# Patient Record
Sex: Female | Born: 1950 | Race: White | Hispanic: No | Smoking: Former smoker
Health system: Southern US, Community
[De-identification: ages and names within clinical notes are randomized; demographics above are authoritative.]

## PROBLEM LIST (undated history)

## (undated) DIAGNOSIS — I10 Essential (primary) hypertension: Secondary | ICD-10-CM

## (undated) DIAGNOSIS — E785 Hyperlipidemia, unspecified: Secondary | ICD-10-CM

## (undated) DIAGNOSIS — E039 Hypothyroidism, unspecified: Secondary | ICD-10-CM

## (undated) DIAGNOSIS — K589 Irritable bowel syndrome without diarrhea: Secondary | ICD-10-CM

## (undated) DIAGNOSIS — K219 Gastro-esophageal reflux disease without esophagitis: Secondary | ICD-10-CM

## (undated) DIAGNOSIS — M1990 Unspecified osteoarthritis, unspecified site: Secondary | ICD-10-CM

## (undated) HISTORY — DX: Hyperlipidemia, unspecified: E78.5

## (undated) HISTORY — DX: Gastro-esophageal reflux disease without esophagitis: K21.9

## (undated) HISTORY — PX: JOINT REPLACEMENT: SHX530

## (undated) HISTORY — DX: Unspecified osteoarthritis, unspecified site: M19.90

## (undated) HISTORY — PX: COLONOSCOPY: SHX174

## (undated) HISTORY — DX: Essential (primary) hypertension: I10

## (undated) HISTORY — PX: ABDOMINAL HYSTERECTOMY: SHX81

## (undated) HISTORY — DX: Hypothyroidism, unspecified: E03.9

## (undated) HISTORY — PX: BILATERAL OOPHORECTOMY: SHX1221

## (undated) HISTORY — PX: CHOLECYSTECTOMY: SHX55

## (undated) HISTORY — DX: Irritable bowel syndrome, unspecified: K58.9

## (undated) HISTORY — PX: SHOULDER SURGERY: SHX246

## (undated) HISTORY — PX: HIP SURGERY: SHX245

---

## 2003-06-18 ENCOUNTER — Other Ambulatory Visit: Payer: Self-pay

## 2005-07-30 ENCOUNTER — Observation Stay: Payer: Self-pay

## 2005-07-30 ENCOUNTER — Other Ambulatory Visit: Payer: Self-pay

## 2005-08-21 ENCOUNTER — Ambulatory Visit: Payer: Self-pay | Admitting: Surgery

## 2006-02-27 ENCOUNTER — Ambulatory Visit: Payer: Self-pay

## 2007-05-25 ENCOUNTER — Ambulatory Visit: Payer: Self-pay

## 2008-06-13 ENCOUNTER — Ambulatory Visit: Payer: Self-pay

## 2009-01-14 ENCOUNTER — Emergency Department (HOSPITAL_COMMUNITY): Admission: EM | Admit: 2009-01-14 | Discharge: 2009-01-14 | Payer: Self-pay | Admitting: Family Medicine

## 2009-10-15 ENCOUNTER — Ambulatory Visit (HOSPITAL_COMMUNITY): Admission: RE | Admit: 2009-10-15 | Discharge: 2009-10-15 | Payer: Self-pay | Admitting: Family Medicine

## 2009-11-26 ENCOUNTER — Ambulatory Visit (HOSPITAL_COMMUNITY): Admission: RE | Admit: 2009-11-26 | Discharge: 2009-11-26 | Payer: Self-pay | Admitting: Family Medicine

## 2009-12-13 ENCOUNTER — Ambulatory Visit (HOSPITAL_COMMUNITY): Admission: RE | Admit: 2009-12-13 | Discharge: 2009-12-13 | Payer: Self-pay | Admitting: Orthopedic Surgery

## 2009-12-26 ENCOUNTER — Encounter (HOSPITAL_COMMUNITY): Admission: RE | Admit: 2009-12-26 | Discharge: 2010-01-25 | Payer: Self-pay | Admitting: Orthopedic Surgery

## 2010-01-28 ENCOUNTER — Encounter (HOSPITAL_COMMUNITY): Admission: RE | Admit: 2010-01-28 | Discharge: 2010-02-27 | Payer: Self-pay | Admitting: Orthopedic Surgery

## 2010-09-20 ENCOUNTER — Ambulatory Visit (HOSPITAL_COMMUNITY)
Admission: RE | Admit: 2010-09-20 | Discharge: 2010-09-20 | Disposition: A | Discharge status: Home / Self Care | Payer: 59 | Source: Ambulatory Visit | Attending: Family Medicine | Admitting: Family Medicine

## 2010-09-20 ENCOUNTER — Other Ambulatory Visit (HOSPITAL_COMMUNITY): Payer: Self-pay | Admitting: Family Medicine

## 2010-09-20 DIAGNOSIS — M1610 Unilateral primary osteoarthritis, unspecified hip: Secondary | ICD-10-CM | POA: Insufficient documentation

## 2010-09-20 DIAGNOSIS — M169 Osteoarthritis of hip, unspecified: Secondary | ICD-10-CM | POA: Insufficient documentation

## 2010-09-20 DIAGNOSIS — M25551 Pain in right hip: Secondary | ICD-10-CM

## 2010-09-20 DIAGNOSIS — M25559 Pain in unspecified hip: Secondary | ICD-10-CM | POA: Insufficient documentation

## 2010-10-07 LAB — COMPREHENSIVE METABOLIC PANEL
ALT: 21 U/L (ref 0–35)
AST: 22 U/L (ref 0–37)
Albumin: 4 g/dL (ref 3.5–5.2)
Alkaline Phosphatase: 81 U/L (ref 39–117)
CO2: 26 mEq/L (ref 19–32)
Chloride: 107 mEq/L (ref 96–112)
Creatinine, Ser: 0.84 mg/dL (ref 0.4–1.2)
GFR calc Af Amer: >60 mL/min (ref >60)
Potassium: 4.2 mEq/L (ref 3.5–5.1)
Sodium: 140 mEq/L (ref 135–145)

## 2010-10-07 LAB — CBC
HCT: 44.7 % (ref 36.0–46.0)
Platelets: 262 10*3/uL (ref 150–400)
RDW: 12.9 % (ref 11.5–15.5)
WBC: 7.5 10*3/uL (ref 4.0–10.5)

## 2010-10-07 LAB — URINE MICROSCOPIC-ADD ON

## 2010-10-07 LAB — URINALYSIS, ROUTINE W REFLEX MICROSCOPIC
Glucose, UA: NEGATIVE mg/dL
Ketones, ur: NEGATIVE mg/dL
Nitrite: NEGATIVE
Protein, ur: NEGATIVE mg/dL
Specific Gravity, Urine: 1.019 (ref 1.005–1.030)

## 2011-01-03 ENCOUNTER — Other Ambulatory Visit: Payer: Self-pay | Admitting: Family Medicine

## 2011-01-03 DIAGNOSIS — Z139 Encounter for screening, unspecified: Secondary | ICD-10-CM

## 2011-01-07 ENCOUNTER — Ambulatory Visit (HOSPITAL_COMMUNITY)
Admission: RE | Admit: 2011-01-07 | Discharge: 2011-01-07 | Disposition: A | Discharge status: Home / Self Care | Payer: 59 | Source: Ambulatory Visit | Attending: Family Medicine | Admitting: Family Medicine

## 2011-01-07 DIAGNOSIS — Z1231 Encounter for screening mammogram for malignant neoplasm of breast: Secondary | ICD-10-CM | POA: Insufficient documentation

## 2011-01-07 DIAGNOSIS — Z139 Encounter for screening, unspecified: Secondary | ICD-10-CM

## 2011-01-29 ENCOUNTER — Other Ambulatory Visit (HOSPITAL_COMMUNITY): Payer: Self-pay | Admitting: Orthopedic Surgery

## 2011-01-29 ENCOUNTER — Other Ambulatory Visit: Payer: Self-pay | Admitting: Orthopedic Surgery

## 2011-01-29 ENCOUNTER — Ambulatory Visit (HOSPITAL_COMMUNITY)
Admission: RE | Admit: 2011-01-29 | Discharge: 2011-01-29 | Disposition: A | Discharge status: Home / Self Care | Payer: 59 | Source: Ambulatory Visit | Attending: Orthopedic Surgery | Admitting: Orthopedic Surgery

## 2011-01-29 ENCOUNTER — Encounter (HOSPITAL_COMMUNITY): Payer: 59

## 2011-01-29 DIAGNOSIS — Z01810 Encounter for preprocedural cardiovascular examination: Secondary | ICD-10-CM | POA: Insufficient documentation

## 2011-01-29 DIAGNOSIS — Z01818 Encounter for other preprocedural examination: Secondary | ICD-10-CM

## 2011-01-29 DIAGNOSIS — I10 Essential (primary) hypertension: Secondary | ICD-10-CM | POA: Insufficient documentation

## 2011-01-29 DIAGNOSIS — Z01812 Encounter for preprocedural laboratory examination: Secondary | ICD-10-CM | POA: Insufficient documentation

## 2011-01-29 LAB — CBC
HCT: 41.2 % (ref 36.0–46.0)
Hemoglobin: 13.5 g/dL (ref 12.0–15.0)
MCH: 30.1 pg (ref 26.0–34.0)
MCV: 91.8 fL (ref 78.0–100.0)
Platelets: 265 10*3/uL (ref 150–400)
RBC: 4.49 MIL/uL (ref 3.87–5.11)
WBC: 6.7 10*3/uL (ref 4.0–10.5)

## 2011-01-29 LAB — URINE MICROSCOPIC-ADD ON

## 2011-01-29 LAB — BASIC METABOLIC PANEL
Calcium: 9.6 mg/dL (ref 8.4–10.5)
Chloride: 104 mEq/L (ref 96–112)
GFR calc Af Amer: 57 mL/min — ABNORMAL LOW (ref >60)
GFR calc non Af Amer: 47 mL/min — ABNORMAL LOW (ref >60)
Potassium: 4.1 mEq/L (ref 3.5–5.1)

## 2011-01-29 LAB — DIFFERENTIAL
Eosinophils Absolute: 0.2 10*3/uL (ref 0.0–0.7)
Lymphocytes Relative: 41 % (ref 12–46)
Monocytes Relative: 10 % (ref 3–12)
Neutrophils Relative %: 46 % (ref 43–77)

## 2011-01-29 LAB — URINALYSIS, ROUTINE W REFLEX MICROSCOPIC
Hgb urine dipstick: NEGATIVE
Protein, ur: NEGATIVE mg/dL
Specific Gravity, Urine: 1.028 (ref 1.005–1.030)
pH: 5.5 (ref 5.0–8.0)

## 2011-01-29 LAB — PROTIME-INR
INR: 1.03 (ref 0.00–1.49)
Prothrombin Time: 13.7 seconds (ref 11.6–15.2)

## 2011-01-29 LAB — APTT: aPTT: 30 seconds (ref 24–37)

## 2011-01-29 NOTE — H&P (Unsigned)
[name], Larsen NO.:  [id number]  MEDICAL RECORD NO.:  [id number]  LOCATION:  XRAY                         FACILITY:  [facility]  PHYSICIAN:  Madlyn Frankel. [name], [name]  DATE OF BIRTH:  Jun 25, 1951  DATE OF ADMISSION:  01/29/2011 DATE OF DISCHARGE:                             HISTORY & PHYSICAL   DATE OF SURGERY:  February 11, 2011.  Note, the patient's medical doctor is Dr. Lubertha South.  She will be going home after surgery.  She is a candidate for tranexamic acid and will receive that at surgery time and she is given her home medications of aspirin, Robaxin, iron, MiraLax and Colace to take postoperatively.  CHIEF COMPLAINT:  Right hip osteoarthritis.  HISTORY OF PRESENT ILLNESS:  This is a 60 year old lady with a history of osteoarthritis of right hip with failure of conservative treatment to alleviate her pain.  After discussion of treatments, benefits, risks and options, the patient now scheduled for anterior total hip arthroplasty on the right.  She has had medical clearance by Dr. Gerda Diss, her medical doctor.  PAST MEDICAL HISTORY:  Drug allergies none.  CURRENT MEDICATIONS: 1. Levothyroxine 150 mcg daily. 2. Pantoprazole 40 mg daily. 3. Hyoscyamine ER 0.375 mg b.i.d. 4. Benazepril. 5. Hydrochlorothiazide 20 mg 1-1/2 tablet daily. 6. Tramadol 50 mg 1 q.6 p.r.n. pain.  PAST SURGERIES:  Hysterectomy, cholecystectomy and shoulder surgery. Serious medical illnesses include hypertension, irritable bowel syndrome, reflux.  FAMILY HISTORY:  Positive for coronary artery disease.  SOCIAL HISTORY:  The patient lives at home.  She does not smoke and does not drink.  REVIEW OF SYSTEMS:  CENTRAL NERVOUS SYSTEM:  Negative for headache, blurred vision or dizziness.  PULMONARY:  Negative for shortness breath, PND or orthopnea.  CARDIOVASCULAR:  Negative for chest pain or palpitation.  GI:  Positive for history of irritable bowel syndrome and reflux.  Negative for  ulcers.  GU:  Negative for urinary tract difficulty.  MUSCULOSKELETAL:  Positive in HPI.  PHYSICAL EXAMINATION:  VITAL SIGNS:  BP 132/78, respirations 16, pulse 82 and regular. GENERAL:  This is well-developed, well-nourished lady in no acute distress. HEENT:  Head normocephalic.  Nose patent.  Ears patent.  Pupils are equal, round and reactive to light.  Throat without injection.  NECK: Supple without adenopathy.  Carotids are 2+ without bruit. CHEST:  Clear to auscultation.  No rales or rhonchi.  Respirations 16. HEART:  Regular rate and rhythm at 82 beats per minute without murmur. ABDOMEN:  Soft with active bowel sounds.  No masses, organomegaly. NEUROLOGIC:  The patient is alert and oriented to time, place and person.  Cranial nerves II-XII grossly intact. EXTREMITIES:  The right hip with decreased range of motion with pain to all motion.  Neurovascular status intact.  IMPRESSION:  Right hip osteoarthritis.  PLAN:  Total hip arthroplasty, anterior approach right hip.     [name]. [name], P.A.   ______________________________ Madlyn Frankel [name], [name]    SJC/MEDQ  D:  01/29/2011  T:  01/29/2011  Job:  [id number]

## 2011-02-11 ENCOUNTER — Inpatient Hospital Stay (HOSPITAL_COMMUNITY): Payer: 59

## 2011-02-11 ENCOUNTER — Inpatient Hospital Stay (HOSPITAL_COMMUNITY)
Admission: RE | Admit: 2011-02-11 | Discharge: 2011-02-13 | DRG: 470 | Disposition: A | Discharge status: Home Health | Payer: 59 | Source: Ambulatory Visit | Attending: Orthopedic Surgery | Admitting: Orthopedic Surgery

## 2011-02-11 DIAGNOSIS — M1610 Unilateral primary osteoarthritis, unspecified hip: Principal | ICD-10-CM | POA: Diagnosis present

## 2011-02-11 DIAGNOSIS — I10 Essential (primary) hypertension: Secondary | ICD-10-CM | POA: Diagnosis present

## 2011-02-11 DIAGNOSIS — K219 Gastro-esophageal reflux disease without esophagitis: Secondary | ICD-10-CM | POA: Diagnosis present

## 2011-02-11 DIAGNOSIS — E039 Hypothyroidism, unspecified: Secondary | ICD-10-CM | POA: Diagnosis present

## 2011-02-11 DIAGNOSIS — M169 Osteoarthritis of hip, unspecified: Principal | ICD-10-CM | POA: Diagnosis present

## 2011-02-11 DIAGNOSIS — Z01818 Encounter for other preprocedural examination: Secondary | ICD-10-CM

## 2011-02-11 DIAGNOSIS — Z01810 Encounter for preprocedural cardiovascular examination: Secondary | ICD-10-CM

## 2011-02-11 DIAGNOSIS — Z01812 Encounter for preprocedural laboratory examination: Secondary | ICD-10-CM

## 2011-02-11 LAB — TYPE AND SCREEN
ABO/RH(D): O NEG
Antibody Screen: NEGATIVE

## 2011-02-12 LAB — BASIC METABOLIC PANEL
BUN: 13 mg/dL (ref 6–23)
Calcium: 9 mg/dL (ref 8.4–10.5)
Creatinine, Ser: 0.77 mg/dL (ref 0.50–1.10)
GFR calc Af Amer: >60 mL/min (ref >60)
Sodium: 138 mEq/L (ref 135–145)

## 2011-02-12 LAB — CBC
MCH: 30.3 pg (ref 26.0–34.0)
MCHC: 34.4 g/dL (ref 30.0–36.0)
Platelets: 280 10*3/uL (ref 150–400)

## 2011-02-13 LAB — BASIC METABOLIC PANEL
BUN: 12 mg/dL (ref 6–23)
CO2: 26 mEq/L (ref 19–32)
Chloride: 108 mEq/L (ref 96–112)
Creatinine, Ser: 0.74 mg/dL (ref 0.50–1.10)
GFR calc Af Amer: >60 mL/min (ref >60)
Glucose, Bld: 104 mg/dL — ABNORMAL HIGH (ref 70–99)
Potassium: 3.2 mEq/L — ABNORMAL LOW (ref 3.5–5.1)

## 2011-02-13 LAB — CBC
HCT: 31.4 % — ABNORMAL LOW (ref 36.0–46.0)
Hemoglobin: 10.7 g/dL — ABNORMAL LOW (ref 12.0–15.0)
MCV: 90 fL (ref 78.0–100.0)
RDW: 13.2 % (ref 11.5–15.5)
WBC: 9.4 10*3/uL (ref 4.0–10.5)

## 2011-02-17 NOTE — Op Note (Signed)
NAMEJOZIE, WULF NO.:  [id number]  MEDICAL RECORD NO.:  [id number]  LOCATION:  1616                         FACILITY:  [facility]  PHYSICIAN:  Madlyn Frankel. Charlann Boxer, [name]  DATE OF BIRTH:  1950-12-09  DATE OF PROCEDURE:  02/11/2011 DATE OF DISCHARGE:                              OPERATIVE REPORT   PREOPERATIVE DIAGNOSIS:  Right hip osteoarthritis.  POSTOPERATIVE DIAGNOSIS:  Right hip osteoarthritis.  PROCEDURE:  Right total hip replacement through an anterior approach.  COMPONENTS USED:  DePuy hip system size 52 Pinnacle cup, 36 +4 neutral AltrX liner, 4 standard Tri-Lock stem with a 36 +1.5 Delta ceramic ball.  SURGEON:  Madlyn Frankel. Charlann Boxer, MD  ASSISTANT:  Lanney Gins, PA-C, plus [name]. Chabon, PA-C  ANESTHESIA:  General.  BLOOD LOSS:  600 cc.  DRAINS:  One Hemovac.  COMPLICATIONS:  None.  SPECIMEN:  None.  INDICATIONS OF PROCEDURE:  Ms. Flannigan is a 60 year old female who presented to the office for right hip pain.  Radiographs revealed extensive bone-on-bone changes.  She had failed to respond to conservative measures and her quality of life was significantly effected.  She wished at this point to proceed with hip arthroplasty. The risk of infection, DVT, component failure, need for revision surgery, and dislocation, were all discussed and reviewed.  Surgical approach, anterior approach, posterior approach were discussed.  She at this point wished to proceed with an anterior approach to a total hip replacement for pain relief.  Consent was obtained.  PROCEDURE IN DETAIL:  The patient was brought to operative theater. Once adequate anesthesia, preoperative antibiotics, Ancef administered, the patient was positioned supine on the OSI Hana table.  Once adequately positioned with the right arm placed over the chest, the right hip area was initially prepped out.  Fluoroscopy was used to confirm the landmarks and position of the pelvis on the  table.  The right hip was then prepped and draped from proximal iliac crest to the mid thigh with a shower curtain technique.  A time-out was performed identifying the patient, planned procedure, and extremity.  At this point, an incision was made 2 cm lateral distal to the anterior- superior iliac spine.  Sharp dissection was carried to the fascia and the tensor fascia muscle.  The fascia was incised, the muscle then identified and swept laterally, retractor placed along the superior neck.  After cauterizing the circumflex vessels removing pericapsular fat, a 2nd retractor placed on the inferior neck.  After elevating the rectus femoris from the anterior acetabulum, retractors were placed in the anterior rim.  I then did a capsulotomy from the anterior rim to the trochanteric fossa, then extended it down distally to the lesser trochanteric region.  This also extended proximally.  Stay sutures were placed and retractor placed intracapsular.  At this point, traction was applied to the femur.  We identified landmarks and confirmed location of the neck cut.  The neck osteotomy was made and femoral head was removed without difficulty and noted severe degenerative changes.  Traction was taken off the femur and attention was now directed to the acetabulum.  Acetabular retractors were placed, posterior retractor and anterior retractor.  Labrum and foveal tissue debrided.  At this point, I began reaming with a 44 reamer and reamed up to a 51 reamer with excellent bony bed preparation.  The final reaming was done under fluoroscopic to confirm position.  The final 52 Pinnacle cup was then chosen and then impacted with a good initial scratch fit using fluoro to confirm the location and the amount of abduction.  Single cancellous screw was placed in the ilium to support initial scratch fit and hole eliminator placed and the final 36 +4 AltrX liner impacted in position.  At this point,  lateral hook was placed on the lateral femur.  The femur was elevated by hand and held in position with table.  The femur was then rotated externally and I released capsule off the inferior neck.  Retractors placed on the medial neck.  At this point, the capsule on the anterior aspect of hip was released along the superior edge to allow for further exposure as well as debridement of some of the soft tissues into the trochanteric fossa.  The leg was then extended and abducted.  At this point, a box osteotome was used to create a starting opening followed by passage of the starting broach by hand.  Basically, I broached to 1 and then confirmed radiographically that the stem was going to be in the correct orientation AP and lateral planes.  Once this was done, I began broaching and broached up to a size 4.  With this size 4 broach in place, a standard offset neck was chosen and trial reduction was carried out 36 +1.5 ball.  The stem appeared to fit well within the proximal aspect of femur and leg lengths were cemented on the greater and lesser trochanters through the ischium.  Given these findings, the hip was dislocated.  The retractors were placed.  The trial components were removed.  The final 4 standard Tri- Lock stem was opened and impacted, it sat at the level of neck cut. After a trial reduction, again I chose a 36 1.5 ball.  The final 36 1.5 Delta ceramic ball was then impacted onto clean and dry trunnion.  The hip was reduced.  We irrigated the hip throughout the case again at this point.  I reapproximated the anterior capsular tissues using #1 Vicryl. The medium Hemovac drain was placed deep in fascia.  The tensor fascia lata muscle was then reapproximated using #1 Vicryl.  The remaining of the wound was closed with 2-0 Vicryl and running 4-0 Monocryl.  The hip was cleaned, dried, and dressed sterilely using Dermabond and Aquacel dressing. Drain site dressed separately.  The  patient was then brought to recovery room in stable condition tolerating the procedure well.     Madlyn Frankel Charlann Boxer, [name]     MDO/MEDQ  D:  02/11/2011  T:  02/12/2011  Job:  [id number]  Electronically Signed by Durene Romans [name] on 02/17/2011 09:29:45 AM

## 2011-03-03 NOTE — Discharge Summary (Signed)
  NAMEVALE, PERAZA NO.:  [id number]  MEDICAL RECORD NO.:  [id number]  LOCATION:  1616                         FACILITY:  [facility]  PHYSICIAN:  Madlyn Frankel. Charlann Boxer, [name]  DATE OF BIRTH:  [birth year]  DATE OF ADMISSION:  02/11/2011 DATE OF DISCHARGE:  02/13/2011                              DISCHARGE SUMMARY   PROCEDURE:  Right total hip arthroplasty.  ADMITTING DIAGNOSIS:  Right hip osteoarthritis.  DISCHARGE DIAGNOSES: 1. Status post total right hip arthroplasty. 2. Hypothyroidism. 3. Gastroesophageal reflux disease. 4. Hypertension.  HISTORY OF PRESENT ILLNESS:  The patient is a 60 year old white lady with a history of osteoarthritis of the right hip. It has been causing pain for quite some time. X-rays in the clinic show degeneration of the right hip.  The patient has tried conservative treatment which has failed.  Various options were discussed with the patient. The patient wishes to proceed with surgery.  Risk, benefits, and expectations of the procedure were discussed with the patient. The patient understand the risks, benefits, and expectations and wishes to proceed with a right total hip arthroplasty per Dr. Charlann Boxer on February 11, 2011 on inpatient basis.  HOSPITAL COURSE:  The patient underwent the above stated procedure.  The patient tolerated the procedure well and was brought to the recovery room in good condition, subsequently to the floor, while throughout the stay, the patients vital signs were stable.  He was afebrile.  The patient had minimal concerns of pain and no real incidences.  Labs were good.  Patient progressed well with physical therapy and recovery so much so that it was felt that she was well enough to be discharged on February 14, 2011.  DISCHARGE CONDITION:  Good.  DISCHARGE PLAN:  The patient  be discharged from the hospital on February 13, 2011 to the home with home PT. The patient will follow with the [facility] and Dr.  Charlann Boxer in 2 weeks. The patient is to be weightbearing as tolerated. The patient is to maintain the surgical dressing for 8 days in place with gauze and tape, keeping the area clean and dry the whole time. The patient was discharged on medications: 1. Levothyroxine 150 mcg daily. 2. Pantoprazole 40 mg daily. 3. Hyoscyamine ER 0.375 mg p.o. b.i.d. 4. Benazepril. 5. HCTZ 20 mg 1-1/2 tablets daily. 6. Norco 7.5/325 mg 1 to 2 p.o. q.4-6 h. 7. Aspirin 325 mg 1 p.o. b.i.d. for 6 weeks. 8. Robaxin 500 mg 1 p.o. q.6 h. p.r.n. muscle spasm. 9. Iron sulfate 325 mg 1 p.o. t.i.d. for 2 or 3 weeks. 10.Colace 100 mg 1 p.o. b.i.d., constipation. 11.MiraLax 17 g 1 p.o. daily, constipation.    ______________________________ Lanney Gins, PA   ______________________________ Madlyn Frankel. Charlann Boxer, [name]    MB/MEDQ  D:  02/13/2011  T:  02/14/2011  Job:  [id number]  Electronically Signed by Lanney Gins PA on 02/26/2011 04:09:47 PM Electronically Signed by Durene Romans [name] on 03/03/2011 07:33:13 AM

## 2011-12-02 ENCOUNTER — Other Ambulatory Visit: Payer: Self-pay | Admitting: Family Medicine

## 2011-12-02 DIAGNOSIS — Z139 Encounter for screening, unspecified: Secondary | ICD-10-CM

## 2012-01-09 ENCOUNTER — Ambulatory Visit (HOSPITAL_COMMUNITY)
Admission: RE | Admit: 2012-01-09 | Discharge: 2012-01-09 | Disposition: A | Discharge status: Home / Self Care | Payer: 59 | Source: Ambulatory Visit | Attending: Family Medicine | Admitting: Family Medicine

## 2012-01-09 DIAGNOSIS — Z139 Encounter for screening, unspecified: Secondary | ICD-10-CM

## 2012-01-09 DIAGNOSIS — Z1231 Encounter for screening mammogram for malignant neoplasm of breast: Secondary | ICD-10-CM | POA: Insufficient documentation

## 2012-11-22 ENCOUNTER — Other Ambulatory Visit (HOSPITAL_COMMUNITY): Payer: Self-pay | Admitting: Family Medicine

## 2012-11-22 ENCOUNTER — Other Ambulatory Visit: Payer: Self-pay | Admitting: Family Medicine

## 2012-11-22 ENCOUNTER — Other Ambulatory Visit: Payer: Self-pay | Admitting: Nurse Practitioner

## 2012-12-06 ENCOUNTER — Other Ambulatory Visit: Payer: Self-pay | Admitting: Family Medicine

## 2012-12-06 DIAGNOSIS — IMO0001 Reserved for inherently not codable concepts without codable children: Secondary | ICD-10-CM

## 2013-01-13 ENCOUNTER — Ambulatory Visit (HOSPITAL_COMMUNITY)
Admission: RE | Admit: 2013-01-13 | Discharge: 2013-01-13 | Disposition: A | Discharge status: Home / Self Care | Payer: 59 | Source: Ambulatory Visit | Attending: Family Medicine | Admitting: Family Medicine

## 2013-01-13 DIAGNOSIS — Z1231 Encounter for screening mammogram for malignant neoplasm of breast: Secondary | ICD-10-CM | POA: Insufficient documentation

## 2013-01-13 DIAGNOSIS — IMO0001 Reserved for inherently not codable concepts without codable children: Secondary | ICD-10-CM

## 2013-01-14 ENCOUNTER — Ambulatory Visit (HOSPITAL_COMMUNITY): Payer: 59

## 2013-01-26 ENCOUNTER — Other Ambulatory Visit: Payer: Self-pay | Admitting: Family Medicine

## 2013-01-28 ENCOUNTER — Encounter: Payer: Self-pay | Admitting: *Deleted

## 2013-03-11 ENCOUNTER — Ambulatory Visit (HOSPITAL_COMMUNITY)
Admission: RE | Admit: 2013-03-11 | Discharge: 2013-03-11 | Disposition: A | Discharge status: Home / Self Care | Payer: 59 | Source: Ambulatory Visit | Attending: Family Medicine | Admitting: Family Medicine

## 2013-03-11 ENCOUNTER — Encounter: Payer: Self-pay | Admitting: Family Medicine

## 2013-03-11 ENCOUNTER — Ambulatory Visit (INDEPENDENT_AMBULATORY_CARE_PROVIDER_SITE_OTHER): Payer: 59 | Admitting: Family Medicine

## 2013-03-11 VITALS — BP 134/88 | Ht 65.0 in | Wt 208.8 lb

## 2013-03-11 DIAGNOSIS — M25559 Pain in unspecified hip: Secondary | ICD-10-CM

## 2013-03-11 DIAGNOSIS — M25551 Pain in right hip: Secondary | ICD-10-CM

## 2013-03-11 DIAGNOSIS — E785 Hyperlipidemia, unspecified: Secondary | ICD-10-CM | POA: Insufficient documentation

## 2013-03-11 DIAGNOSIS — I10 Essential (primary) hypertension: Secondary | ICD-10-CM

## 2013-03-11 DIAGNOSIS — Z79899 Other long term (current) drug therapy: Secondary | ICD-10-CM

## 2013-03-11 DIAGNOSIS — M47817 Spondylosis without myelopathy or radiculopathy, lumbosacral region: Secondary | ICD-10-CM | POA: Insufficient documentation

## 2013-03-11 DIAGNOSIS — M51379 Other intervertebral disc degeneration, lumbosacral region without mention of lumbar back pain or lower extremity pain: Secondary | ICD-10-CM | POA: Insufficient documentation

## 2013-03-11 DIAGNOSIS — K219 Gastro-esophageal reflux disease without esophagitis: Secondary | ICD-10-CM | POA: Insufficient documentation

## 2013-03-11 DIAGNOSIS — M5137 Other intervertebral disc degeneration, lumbosacral region: Secondary | ICD-10-CM | POA: Insufficient documentation

## 2013-03-11 DIAGNOSIS — E039 Hypothyroidism, unspecified: Secondary | ICD-10-CM

## 2013-03-11 NOTE — Progress Notes (Signed)
  Subjective:    Patient ID: Deanna Larsen, female    DOB: [birth year], 62 y.o.   MRN: [id number]  Hyperlipidemia This is a chronic problem. The problem is controlled. Recent lipid tests were reviewed and are normal. Exacerbating diseases include hypothyroidism. She has no history of chronic renal disease or diabetes. There are no known factors aggravating her hyperlipidemia. Current antihyperlipidemic treatment includes statins. The current treatment provides moderate improvement of lipids. There are no compliance problems.  Risk factors for coronary artery disease include dyslipidemia.     Pt here for intermitting right hip pain. Started 2 weeks ago. Would like an x-ray.   Patient also neglected to return for her six-month followup visit. Reports her reflux is stable. Stain with medicines.  Patient claims compliance with her blood pressure medicine. Trying to watch her salt intake. She is exercising some.  No symptoms of high or low thyroid. No blood work check since last fall. Tries not to miss any pills no fatigue.  Claims compliance with cholesterol medicine. Mostly stain with the dosage he tried watch her diet    Review of Systems No chest pain no abdominal pain no back pain no shortness of breath ROS otherwise negative    Objective:   Physical Exam  Alert HEENT normal. Lungs clear. Heart regular in rhythm. Feet without edema. Right posterior hip tenderness deep palpation hip good range of motion negative straight leg raise      Assessment & Plan:  Impression probable non-articular right hip pain posterior in the region of bursa or ligaments. Patient very worried about her surgical hip site #2 hypertension good control. #3 hyperlipidemia status uncertain. #4 hypothyroidism status uncertain. Plan diet exercise discussed in encourage. Appropriate blood work. Further recommendations based on results. X-ray right hip addendum x-ray returned very good. Symptomatic care discussed.  WSL

## 2013-03-16 LAB — LIPID PANEL
HDL: 48 mg/dL (ref >39)
LDL Cholesterol: 119 mg/dL — ABNORMAL HIGH (ref 0–99)
Total CHOL/HDL Ratio: 3.9 Ratio
VLDL: 20 mg/dL (ref 0–40)

## 2013-03-16 LAB — BASIC METABOLIC PANEL
CO2: 28 mEq/L (ref 19–32)
Calcium: 9.9 mg/dL (ref 8.4–10.5)
Glucose, Bld: 98 mg/dL (ref 70–99)
Potassium: 4.4 mEq/L (ref 3.5–5.3)
Sodium: 140 mEq/L (ref 135–145)

## 2013-03-16 LAB — HEPATIC FUNCTION PANEL
Albumin: 4 g/dL (ref 3.5–5.2)
Bilirubin, Direct: 0.1 mg/dL (ref 0.0–0.3)
Total Bilirubin: 0.5 mg/dL (ref 0.3–1.2)

## 2013-03-18 ENCOUNTER — Other Ambulatory Visit: Payer: Self-pay

## 2013-03-18 MED ORDER — LEVOTHYROXINE SODIUM 150 MCG PO TABS: 150.0000 ug | ORAL_TABLET | Freq: Every day | ORAL | Status: DC

## 2013-05-23 ENCOUNTER — Other Ambulatory Visit: Payer: Self-pay | Admitting: *Deleted

## 2013-05-23 ENCOUNTER — Telehealth: Payer: Self-pay | Admitting: Family Medicine

## 2013-05-23 MED ORDER — PRAVASTATIN SODIUM 20 MG PO TABS: 20.0000 mg | ORAL_TABLET | Freq: Every day | ORAL | Status: DC

## 2013-05-23 MED ORDER — LEVOTHYROXINE SODIUM 150 MCG PO TABS: 150.0000 ug | ORAL_TABLET | Freq: Every day | ORAL | Status: DC

## 2013-05-23 MED ORDER — BENAZEPRIL HCL 20 MG PO TABS: ORAL_TABLET | ORAL | Status: DC

## 2013-05-23 NOTE — Telephone Encounter (Signed)
Scripts for 90 day supply ready for pick up. Pt notified.

## 2013-05-23 NOTE — Telephone Encounter (Signed)
Needs prescriptions for the following medications:  benazepril (LOTENSIN) 20 MG tablet pravastatin (PRAVACHOL) 20 MG tablet levothyroxine (SYNTHROID) 150 MCG tablet  States she is no longer with [facility] therefore she is not using [facility].  She wants written prescriptions due to the fact she does not know which pharmacy she wants to use.  She only has three days of the prescriptions left before she is completely out.  Please call patient to discuss.

## 2013-08-08 ENCOUNTER — Telehealth: Payer: Self-pay | Admitting: Family Medicine

## 2013-08-08 ENCOUNTER — Other Ambulatory Visit: Payer: Self-pay | Admitting: Nurse Practitioner

## 2013-08-08 DIAGNOSIS — E039 Hypothyroidism, unspecified: Secondary | ICD-10-CM

## 2013-08-08 DIAGNOSIS — Z79899 Other long term (current) drug therapy: Secondary | ICD-10-CM

## 2013-08-08 DIAGNOSIS — E785 Hyperlipidemia, unspecified: Secondary | ICD-10-CM

## 2013-08-08 NOTE — Telephone Encounter (Signed)
Need blood work orders, 6 month follow up here with Eber Jonesarolyn on 09/07/13, please call pt when done [phone]

## 2013-08-08 NOTE — Telephone Encounter (Signed)
Patient notified

## 2013-08-08 NOTE — Telephone Encounter (Signed)
Orders done. Papers at nurses' desk.

## 2013-08-31 LAB — HEPATIC FUNCTION PANEL
ALT: 21 U/L (ref 0–35)
AST: 21 U/L (ref 0–37)
Albumin: 3.7 g/dL (ref 3.5–5.2)
Alkaline Phosphatase: 81 U/L (ref 39–117)
BILIRUBIN TOTAL: 0.5 mg/dL (ref 0.2–1.2)
Bilirubin, Direct: 0.1 mg/dL (ref 0.0–0.3)
Indirect Bilirubin: 0.4 mg/dL (ref 0.2–1.2)
Total Protein: 6.6 g/dL (ref 6.0–8.3)

## 2013-08-31 LAB — LIPID PANEL
Cholesterol: 197 mg/dL (ref 0–200)
HDL: 43 mg/dL (ref >39)
LDL CALC: 128 mg/dL — AB (ref 0–99)
TRIGLYCERIDES: 129 mg/dL (ref <150)
Total CHOL/HDL Ratio: 4.6 Ratio
VLDL: 26 mg/dL (ref 0–40)

## 2013-08-31 LAB — TSH: TSH: 3.162 u[IU]/mL (ref 0.350–4.500)

## 2013-09-07 ENCOUNTER — Ambulatory Visit: Payer: 59 | Admitting: Nurse Practitioner

## 2013-09-14 ENCOUNTER — Ambulatory Visit: Payer: 59 | Admitting: Nurse Practitioner

## 2013-09-21 ENCOUNTER — Encounter: Payer: Self-pay | Admitting: Nurse Practitioner

## 2013-09-21 ENCOUNTER — Ambulatory Visit (INDEPENDENT_AMBULATORY_CARE_PROVIDER_SITE_OTHER): Payer: BC Managed Care – PPO | Admitting: Nurse Practitioner

## 2013-09-21 VITALS — BP 138/86 | Ht 65.0 in | Wt 221.0 lb

## 2013-09-21 DIAGNOSIS — E039 Hypothyroidism, unspecified: Secondary | ICD-10-CM

## 2013-09-21 DIAGNOSIS — I10 Essential (primary) hypertension: Secondary | ICD-10-CM

## 2013-09-21 DIAGNOSIS — E785 Hyperlipidemia, unspecified: Secondary | ICD-10-CM

## 2013-09-21 DIAGNOSIS — K219 Gastro-esophageal reflux disease without esophagitis: Secondary | ICD-10-CM

## 2013-09-21 MED ORDER — BENAZEPRIL HCL 20 MG PO TABS: ORAL_TABLET | ORAL | Status: DC

## 2013-09-21 MED ORDER — LEVOTHYROXINE SODIUM 150 MCG PO TABS: 150.0000 ug | ORAL_TABLET | Freq: Every day | ORAL | Status: DC

## 2013-09-21 MED ORDER — PANTOPRAZOLE SODIUM 40 MG PO TBEC: DELAYED_RELEASE_TABLET | ORAL | Status: DC

## 2013-09-21 MED ORDER — PRAVASTATIN SODIUM 20 MG PO TABS: 20.0000 mg | ORAL_TABLET | Freq: Every day | ORAL | Status: DC

## 2013-09-21 NOTE — Progress Notes (Signed)
Subjective:  Presents for routine followup. Compliant with medications. No chest pain or shortness of breath. No difficulty swallowing. Has been off her Protonix for while. Has a history of GERD. Has been having a flareup lately. Usually worse with certain foods. No change in her bowels. Rare NSAID use. No nausea vomiting. Has had a cholecystectomy. Has begun a walking program about 10 minutes per day. Switch to regular soda from diet based on advice from some of her friends. Has gained about 11 pounds over the past year.  Objective:   BP 138/86  Ht 5\' 5"  (1.651 m)  Wt 221 lb (100.245 kg)  BMI 36.78 kg/m2 NAD. Alert, oriented. Lungs clear. Heart regular rate rhythm. Lower extremities no edema. Thyroid nontender to palpation, no masses or goiter are noted. See lab work 2/11.  Assessment: Problem List Items Addressed This Visit     Cardiovascular and Mediastinum   Essential hypertension, benign - Primary   Relevant Medications      benazepril (LOTENSIN) tablet      pravastatin (PRAVACHOL) tablet     Digestive   Esophageal reflux   Relevant Medications      pantoprazole (PROTONIX) EC tablet     Endocrine   Unspecified hypothyroidism   Relevant Medications      levothyroxine (SYNTHROID, LEVOTHROID) tablet     Other   Other and unspecified hyperlipidemia     Plan: Restart Protonix as directed. Given written and verbal information on lifestyle factors affecting her reflux. Given 6 months refills on her medications. Reminded about preventive health physical. Return in about 6 months (around 03/24/2014). Call back in 2 weeks if reflux persists.

## 2013-09-21 NOTE — Patient Instructions (Signed)

## 2013-11-21 ENCOUNTER — Telehealth: Payer: Self-pay | Admitting: Family Medicine

## 2013-11-21 ENCOUNTER — Other Ambulatory Visit: Payer: Self-pay | Admitting: *Deleted

## 2013-11-21 MED ORDER — PRAVASTATIN SODIUM 20 MG PO TABS: 20.0000 mg | ORAL_TABLET | Freq: Every day | ORAL | Status: DC

## 2013-11-21 MED ORDER — LEVOTHYROXINE SODIUM 150 MCG PO TABS: 150.0000 ug | ORAL_TABLET | Freq: Every day | ORAL | Status: DC

## 2013-11-21 MED ORDER — BENAZEPRIL HCL 20 MG PO TABS: ORAL_TABLET | ORAL | Status: DC

## 2013-11-21 NOTE — Telephone Encounter (Signed)
meds sent to [facility] in [place]. Pt notified on voicemail.

## 2013-11-21 NOTE — Telephone Encounter (Signed)
Patient said [facility] does not have any record of us calling in her medications at her last visit. She needs refills of her benazepril (LOTENSIN) 20 MG tablet, levothyroxine (SYNTHROID) 150 MCG tablet, and pravastatin (PRAVACHOL) 20 MG tablet.    [facility] 

## 2013-12-07 ENCOUNTER — Other Ambulatory Visit: Payer: Self-pay | Admitting: Family Medicine

## 2013-12-07 DIAGNOSIS — Z139 Encounter for screening, unspecified: Secondary | ICD-10-CM

## 2014-01-16 ENCOUNTER — Ambulatory Visit (HOSPITAL_COMMUNITY)
Admission: RE | Admit: 2014-01-16 | Discharge: 2014-01-16 | Disposition: A | Discharge status: Home / Self Care | Payer: BC Managed Care – PPO | Source: Ambulatory Visit | Attending: Family Medicine | Admitting: Family Medicine

## 2014-01-16 DIAGNOSIS — Z139 Encounter for screening, unspecified: Secondary | ICD-10-CM

## 2014-01-16 DIAGNOSIS — Z1231 Encounter for screening mammogram for malignant neoplasm of breast: Secondary | ICD-10-CM | POA: Insufficient documentation

## 2014-05-23 ENCOUNTER — Encounter: Payer: Self-pay | Admitting: Family Medicine

## 2014-05-23 ENCOUNTER — Ambulatory Visit (INDEPENDENT_AMBULATORY_CARE_PROVIDER_SITE_OTHER): Payer: BC Managed Care – PPO | Admitting: Family Medicine

## 2014-05-23 VITALS — Ht 65.0 in

## 2014-05-23 DIAGNOSIS — J329 Chronic sinusitis, unspecified: Secondary | ICD-10-CM

## 2014-05-23 DIAGNOSIS — J310 Chronic rhinitis: Secondary | ICD-10-CM

## 2014-05-23 MED ORDER — BENZONATATE 100 MG PO CAPS: 100.0000 mg | ORAL_CAPSULE | Freq: Four times a day (QID) | ORAL | Status: DC | PRN

## 2014-05-23 MED ORDER — CEFDINIR 300 MG PO CAPS: 300.0000 mg | ORAL_CAPSULE | Freq: Two times a day (BID) | ORAL | Status: DC

## 2014-05-23 NOTE — Progress Notes (Signed)
   Subjective:    Patient ID: Deanna BaarsDeborah H Larsen, female    DOB: July 02, 1951, 63 y.o.   MRN: [id number]  Sinus Problem This is a new problem. The current episode started in the past 7 days. Associated symptoms include congestion, coughing and headaches.   Diminished energy and achey  Headache frontal  Gunky lcongestion  No vom no diarrhea  Had flu shot     Review of Systems  HENT: Positive for congestion.   Respiratory: Positive for cough.   Neurological: Positive for headaches.       Objective:   Physical Exam Alert moderate malaise TMs retracted frontal maxillary tenderness. Pharynx slight erythema neck supple. Lungs clear. Heart regular rate and rhythm.       Assessment & Plan:  Impression acute rhinosinusitis plan antibiotics prescribed. Symptomatic care discussed. Warning signs discussed. WSL

## 2014-06-07 ENCOUNTER — Telehealth: Payer: Self-pay | Admitting: Family Medicine

## 2014-06-07 MED ORDER — HYDROCODONE-HOMATROPINE 5-1.5 MG/5ML PO SYRP: 5.0000 mL | ORAL_SOLUTION | Freq: Every evening | ORAL | Status: DC | PRN

## 2014-06-07 MED ORDER — AMOXICILLIN-POT CLAVULANATE 875-125 MG PO TABS: 1.0000 | ORAL_TABLET | Freq: Two times a day (BID) | ORAL | Status: AC

## 2014-06-07 NOTE — Telephone Encounter (Signed)
Aug 875 bid ten d, hycodan three ounces one tspn qhs prn cough

## 2014-06-07 NOTE — Telephone Encounter (Signed)
Rx sent electronically to pharmacy. Patient notified. 

## 2014-06-07 NOTE — Telephone Encounter (Signed)
Patient seen on 05/23/14 for rhinosinusitis and is still having cough, thick mucous.  Can we give her more medication?  She said that the cough medication she was given is not helping.  [facility]

## 2014-08-15 ENCOUNTER — Encounter: Payer: Self-pay | Admitting: Family Medicine

## 2014-08-15 ENCOUNTER — Ambulatory Visit (INDEPENDENT_AMBULATORY_CARE_PROVIDER_SITE_OTHER): Payer: 59 | Admitting: Family Medicine

## 2014-08-15 VITALS — Temp 98.2°F | Ht 65.0 in | Wt 217.6 lb

## 2014-08-15 DIAGNOSIS — B9689 Other specified bacterial agents as the cause of diseases classified elsewhere: Secondary | ICD-10-CM

## 2014-08-15 DIAGNOSIS — J019 Acute sinusitis, unspecified: Secondary | ICD-10-CM

## 2014-08-15 MED ORDER — LEVOFLOXACIN 500 MG PO TABS: 500.0000 mg | ORAL_TABLET | Freq: Every day | ORAL | Status: DC

## 2014-08-15 NOTE — Progress Notes (Signed)
   Subjective:    Patient ID: Deanna BaarsDeborah H Engebretson, female    DOB: Aug 27, 1950, 64 y.o.   MRN: [id number]  Sinus Problem This is a new problem. The current episode started in the past 7 days. Associated symptoms include congestion, coughing and sinus pressure. Pertinent negatives include no ear pain or shortness of breath.   PMH benign denies chest pressure or tightness   Review of Systems  Constitutional: Negative for fever and activity change.  HENT: Positive for congestion, rhinorrhea and sinus pressure. Negative for ear pain.   Eyes: Negative for discharge.  Respiratory: Positive for cough. Negative for shortness of breath and wheezing.   Cardiovascular: Negative for chest pain.   Patient relates did have some fever and chills a couple days ago. Having a lot of head congestion with postnasal drainage. Relates sinus pain discomfort. Worse over the past few days.    Objective:   Physical Exam  Constitutional: She appears well-developed.  HENT:  Head: Normocephalic.  Nose: Nose normal.  Mouth/Throat: Oropharynx is clear and moist. No oropharyngeal exudate.  Neck: Neck supple.  Cardiovascular: Normal rate and normal heart sounds.   No murmur heard. Pulmonary/Chest: Effort normal and breath sounds normal. She has no wheezes.  Lymphadenopathy:    She has no cervical adenopathy.  Skin: Skin is warm and dry.  Nursing note and vitals reviewed.         Assessment & Plan:  Viral syndrome Acute bacterial rhinosinusitis Antibiotics prescribed Warning signs discuss. Do not feel the patient needs x-rays or lab work currently.

## 2014-08-21 ENCOUNTER — Telehealth: Payer: Self-pay | Admitting: Family Medicine

## 2014-08-21 DIAGNOSIS — E785 Hyperlipidemia, unspecified: Secondary | ICD-10-CM

## 2014-08-21 DIAGNOSIS — E039 Hypothyroidism, unspecified: Secondary | ICD-10-CM

## 2014-08-21 DIAGNOSIS — Z79899 Other long term (current) drug therapy: Secondary | ICD-10-CM

## 2014-08-21 NOTE — Telephone Encounter (Signed)
Pt is requesting labs for upcoming appt on 08/25/14.  Previous labs: Hepatic,Lipid,TSH 08/31/2013

## 2014-08-21 NOTE — Telephone Encounter (Signed)
Pt notified on voicemail that bw orders are ready.

## 2014-08-21 NOTE — Telephone Encounter (Signed)
Lip liv m7 tsh 

## 2014-08-22 LAB — HEPATIC FUNCTION PANEL
ALT: 16 U/L (ref 0–35)
AST: 20 U/L (ref 0–37)
Albumin: 3.9 g/dL (ref 3.5–5.2)
Alkaline Phosphatase: 79 U/L (ref 39–117)
BILIRUBIN INDIRECT: 0.5 mg/dL (ref 0.2–1.2)
Bilirubin, Direct: 0.1 mg/dL (ref 0.0–0.3)
Total Bilirubin: 0.6 mg/dL (ref 0.2–1.2)
Total Protein: 7 g/dL (ref 6.0–8.3)

## 2014-08-22 LAB — LIPID PANEL
CHOL/HDL RATIO: 4.2 ratio
Cholesterol: 194 mg/dL (ref 0–200)
HDL: 46 mg/dL (ref >39)
LDL Cholesterol: 121 mg/dL — ABNORMAL HIGH (ref 0–99)
Triglycerides: 133 mg/dL (ref <150)
VLDL: 27 mg/dL (ref 0–40)

## 2014-08-22 LAB — BASIC METABOLIC PANEL
BUN: 15 mg/dL (ref 6–23)
CO2: 24 meq/L (ref 19–32)
CREATININE: 0.88 mg/dL (ref 0.50–1.10)
Calcium: 9.4 mg/dL (ref 8.4–10.5)
Chloride: 108 mEq/L (ref 96–112)
GLUCOSE: 97 mg/dL (ref 70–99)
Potassium: 4.1 mEq/L (ref 3.5–5.3)
SODIUM: 142 meq/L (ref 135–145)

## 2014-08-23 LAB — TSH: TSH: 4.862 u[IU]/mL — AB (ref 0.350–4.500)

## 2014-08-25 ENCOUNTER — Encounter: Payer: Self-pay | Admitting: Family Medicine

## 2014-08-25 ENCOUNTER — Ambulatory Visit (INDEPENDENT_AMBULATORY_CARE_PROVIDER_SITE_OTHER): Payer: 59 | Admitting: Family Medicine

## 2014-08-25 VITALS — BP 130/82 | Ht 65.0 in | Wt 221.6 lb

## 2014-08-25 DIAGNOSIS — E785 Hyperlipidemia, unspecified: Secondary | ICD-10-CM

## 2014-08-25 DIAGNOSIS — I10 Essential (primary) hypertension: Secondary | ICD-10-CM

## 2014-08-25 DIAGNOSIS — K219 Gastro-esophageal reflux disease without esophagitis: Secondary | ICD-10-CM

## 2014-08-25 DIAGNOSIS — E039 Hypothyroidism, unspecified: Secondary | ICD-10-CM

## 2014-08-25 MED ORDER — LEVOTHYROXINE SODIUM 175 MCG PO TABS: 175.0000 ug | ORAL_TABLET | Freq: Every day | ORAL | Status: DC

## 2014-08-25 NOTE — Progress Notes (Signed)
   Subjective:    Patient ID: Deanna BaarsDeborah H Vandrunen, female    DOB: 01-04-1951, 64 y.o.   MRN: [id number]  Hypertension This is a chronic problem. The current episode started more than 1 year ago. Risk factors for coronary artery disease include dyslipidemia and post-menopausal state. Treatments tried: benzapril. There are no compliance problems.    Results for orders placed or performed in visit on 08/21/14  Lipid panel  Result Value Ref Range   Cholesterol 194 0 - 200 mg/dL   Triglycerides 841133 <324<150 mg/dL   HDL 46 >40>39 mg/dL   Total CHOL/HDL Ratio 4.2 Ratio   VLDL 27 0 - 40 mg/dL   LDL Cholesterol 102121 (H) 0 - 99 mg/dL  Hepatic function panel  Result Value Ref Range   Total Bilirubin 0.6 0.2 - 1.2 mg/dL   Bilirubin, Direct 0.1 0.0 - 0.3 mg/dL   Indirect Bilirubin 0.5 0.2 - 1.2 mg/dL   Alkaline Phosphatase 79 39 - 117 U/L   AST 20 0 - 37 U/L   ALT 16 0 - 35 U/L   Total Protein 7.0 6.0 - 8.3 g/dL   Albumin 3.9 3.5 - 5.2 g/dL  Basic metabolic panel  Result Value Ref Range   Sodium 142 135 - 145 mEq/L   Potassium 4.1 3.5 - 5.3 mEq/L   Chloride 108 96 - 112 mEq/L   CO2 24 19 - 32 mEq/L   Glucose, Bld 97 70 - 99 mg/dL   BUN 15 6 - 23 mg/dL   Creat 7.250.88 3.660.50 - 4.401.10 mg/dL   Calcium 9.4 8.4 - 34.710.5 mg/dL  TSH  Result Value Ref Range   TSH 4.862 (H) 0.350 - 4.500 uIU/mL   Discuss recent lab results.  Sticks with meds regularly. Generally does not lipid medicine lipid medication. Watching fat intake.  Exercises as often as possible  Stays active at home and with gkids.  compliant with thyroid as mbest as she can  Tries to walk   Grades diet as so so, eats out a lot, eats veggies and salads.drinks water at home, but mountain dew at home, regular, and coffee  Some suga r in the coffee. Has experienced significant weight gain    Review of Systems No headache no chest pain no back pain abdominal pain no change in bowel habits no blood in stool ROS otherwise negative      Objective:   Physical Exam Alert no acute distress. HEENT normal. Lungs clear heart regular in rhythm. Thyroid nonpalpable. Ankles without edema.       Assessment & Plan:  Impression 1 hypertension good control discussed #2 hyperlipidemia controlled good but not perfect discussed #3 hypothyroidism suboptimum control. Possible factor #4 #4 weight gain. Plan adjust thyroid. Rationale discussed. Diet exercise discussed. Maintain other medications. Check every 6 months. Yearly wellness exam with Eber JonesCarolyn strongly encourage. WSL

## 2014-10-01 ENCOUNTER — Other Ambulatory Visit: Payer: Self-pay | Admitting: Nurse Practitioner

## 2014-10-02 ENCOUNTER — Telehealth: Payer: Self-pay | Admitting: Family Medicine

## 2014-10-02 ENCOUNTER — Other Ambulatory Visit: Payer: Self-pay | Admitting: *Deleted

## 2014-10-02 ENCOUNTER — Other Ambulatory Visit: Payer: Self-pay | Admitting: Nurse Practitioner

## 2014-10-02 MED ORDER — PRAVASTATIN SODIUM 20 MG PO TABS
20.0000 mg | ORAL_TABLET | Freq: Every day | ORAL | Status: DC
Start: 2014-10-02 — End: 2015-03-05

## 2014-10-02 MED ORDER — BENAZEPRIL HCL 20 MG PO TABS: ORAL_TABLET | ORAL | Status: DC

## 2014-10-02 NOTE — Telephone Encounter (Signed)
Pt needs 90 day refills on her meds Benazepril 20 mg and pravastatin 20 mg.   [facility] [place]

## 2014-10-02 NOTE — Telephone Encounter (Signed)
meds refilled. Pt notified.

## 2015-01-03 ENCOUNTER — Other Ambulatory Visit: Payer: Self-pay | Admitting: Family Medicine

## 2015-01-03 DIAGNOSIS — Z1231 Encounter for screening mammogram for malignant neoplasm of breast: Secondary | ICD-10-CM

## 2015-01-08 ENCOUNTER — Encounter: Payer: Self-pay | Admitting: Family Medicine

## 2015-01-08 ENCOUNTER — Ambulatory Visit (INDEPENDENT_AMBULATORY_CARE_PROVIDER_SITE_OTHER): Payer: 59 | Admitting: Family Medicine

## 2015-01-08 VITALS — BP 124/80 | Temp 97.9°F | Ht 65.0 in | Wt 209.4 lb

## 2015-01-08 DIAGNOSIS — J020 Streptococcal pharyngitis: Secondary | ICD-10-CM | POA: Diagnosis not present

## 2015-01-08 DIAGNOSIS — J029 Acute pharyngitis, unspecified: Secondary | ICD-10-CM | POA: Diagnosis not present

## 2015-01-08 LAB — POCT RAPID STREP A (OFFICE): Rapid Strep A Screen: POSITIVE — AB

## 2015-01-08 MED ORDER — AZITHROMYCIN 250 MG PO TABS: ORAL_TABLET | ORAL | Status: DC

## 2015-01-08 NOTE — Progress Notes (Signed)
   Subjective:    Patient ID: Deanna Larsen, female    DOB: 1950/12/31, 64 y.o.   MRN: [id number]  Sore Throat  This is a new problem. The current episode started yesterday. The problem has been unchanged. There has been no fever. The pain is at a severity of 0/10. The patient is experiencing no pain. Associated symptoms comments: Sneezing, runny nose. She has tried NSAIDs (throat lozenges) for the symptoms. The treatment provided no relief.  Patient has no other concerns at this time.   Results for orders placed or performed in visit on 01/08/15  POCT rapid strep A  Result Value Ref Range   Rapid Strep A Screen Positive (A) Negative     Headache with iyt  Frontal in nature  Some stuffiness and dim energy  No low gr fever  Somewhat diminished energy  Review of Systems No vomiting no diarrhea no rash mild headache no chest pain    Objective:   Physical Exam  Alert vitals stable pharynx erythematous positive exudate tender anterior nodes positive nasal congestion. Lungs clear. Heart regular in rhythm.  Positive strep screen      Assessment & Plan:  Impression strep throat plan Z-Pak. Symptom care discussed warning signs discussed WSL

## 2015-01-19 ENCOUNTER — Ambulatory Visit (HOSPITAL_COMMUNITY)
Admission: RE | Admit: 2015-01-19 | Discharge: 2015-01-19 | Disposition: A | Discharge status: Home / Self Care | Payer: 59 | Source: Ambulatory Visit | Attending: Family Medicine | Admitting: Family Medicine

## 2015-01-19 DIAGNOSIS — Z1231 Encounter for screening mammogram for malignant neoplasm of breast: Secondary | ICD-10-CM | POA: Insufficient documentation

## 2015-03-05 ENCOUNTER — Ambulatory Visit (INDEPENDENT_AMBULATORY_CARE_PROVIDER_SITE_OTHER): Payer: 59 | Admitting: Family Medicine

## 2015-03-05 ENCOUNTER — Encounter: Payer: Self-pay | Admitting: Family Medicine

## 2015-03-05 VITALS — BP 136/86 | Ht 65.0 in | Wt 209.2 lb

## 2015-03-05 DIAGNOSIS — Z79899 Other long term (current) drug therapy: Secondary | ICD-10-CM

## 2015-03-05 DIAGNOSIS — I10 Essential (primary) hypertension: Secondary | ICD-10-CM

## 2015-03-05 DIAGNOSIS — E785 Hyperlipidemia, unspecified: Secondary | ICD-10-CM

## 2015-03-05 DIAGNOSIS — E039 Hypothyroidism, unspecified: Secondary | ICD-10-CM | POA: Diagnosis not present

## 2015-03-05 DIAGNOSIS — K219 Gastro-esophageal reflux disease without esophagitis: Secondary | ICD-10-CM

## 2015-03-05 MED ORDER — BENAZEPRIL HCL 20 MG PO TABS: ORAL_TABLET | ORAL | Status: DC

## 2015-03-05 MED ORDER — PRAVASTATIN SODIUM 20 MG PO TABS: 20.0000 mg | ORAL_TABLET | Freq: Every day | ORAL | Status: DC

## 2015-03-05 MED ORDER — LEVOTHYROXINE SODIUM 175 MCG PO TABS: 175.0000 ug | ORAL_TABLET | Freq: Every day | ORAL | Status: DC

## 2015-03-05 MED ORDER — PANTOPRAZOLE SODIUM 40 MG PO TBEC: DELAYED_RELEASE_TABLET | ORAL | Status: DC

## 2015-03-05 NOTE — Progress Notes (Signed)
   Subjective:    Patient ID: Deanna Larsen, female    DOB: 12-29-1950, 64 y.o.   MRN: [id number]  Hypertension This is a chronic problem. The current episode started more than 1 year ago. Treatments tried: Lotensin. There are no compliance problems.     Patient would like to discuss going to get a sleep study.  Pt snoring and talking in the sleep.  No drowsiness during the day.  No sig daytime fatigue  Exercise is relatively good, Two to three times per wk  Chicken diet, diet lfoods   Pt will wake up and have trouble getting bk to sleep  Patient states no other concerns this visit.  Rare intake of fried foods  Patient compliant with lipid medicine. No obvious side effects. Trying to watch diet.  Compliant with thyroid medicine. Does not miss a dose. No fatigue.  Review of Systems No headache no chest pain no back pain no abdominal pain no change in bowel habits no blood in stool    Objective:   Physical Exam  Alert vital stable thyroid nonpalpable. HEENT normal lungs clear. Heart regular in rhythm. Obesity present      Assessment & Plan:  Impression #1 hypertension good control #2 hyperlipidemia control uncertain #3 hypothyroidism control uncertain #4 sleep disturbance not enough symptoms to support diagnosis and workup for sleep apnea. Discussed at length plan appropriate blood work. Diet exercise discussed in encourage. Recheck in 6 months. WSL

## 2015-03-10 LAB — HEPATIC FUNCTION PANEL
ALK PHOS: 95 IU/L (ref 39–117)
ALT: 18 IU/L (ref 0–32)
AST: 18 IU/L (ref 0–40)
Albumin: 4 g/dL (ref 3.6–4.8)
BILIRUBIN TOTAL: 0.6 mg/dL (ref 0.0–1.2)
BILIRUBIN, DIRECT: 0.14 mg/dL (ref 0.00–0.40)
Total Protein: 6.5 g/dL (ref 6.0–8.5)

## 2015-03-10 LAB — LIPID PANEL
Chol/HDL Ratio: 4.4 ratio units (ref 0.0–4.4)
Cholesterol, Total: 194 mg/dL (ref 100–199)
HDL: 44 mg/dL (ref >39)
LDL Calculated: 120 mg/dL — ABNORMAL HIGH (ref 0–99)
TRIGLYCERIDES: 148 mg/dL (ref 0–149)
VLDL Cholesterol Cal: 30 mg/dL (ref 5–40)

## 2015-03-10 LAB — TSH: TSH: 0.167 u[IU]/mL — AB (ref 0.450–4.500)

## 2015-03-12 MED ORDER — LEVOTHYROXINE SODIUM 150 MCG PO TABS: 150.0000 ug | ORAL_TABLET | Freq: Every day | ORAL | Status: DC

## 2015-03-12 NOTE — Addendum Note (Signed)
Addended by: Theodora Blow R on: 03/12/2015 04:09 PM   Modules accepted: Orders

## 2015-06-15 ENCOUNTER — Encounter: Payer: Self-pay | Admitting: Family Medicine

## 2015-06-15 ENCOUNTER — Ambulatory Visit (INDEPENDENT_AMBULATORY_CARE_PROVIDER_SITE_OTHER): Payer: 59 | Admitting: Family Medicine

## 2015-06-15 VITALS — BP 130/88 | Temp 98.5°F | Ht 65.0 in | Wt 209.0 lb

## 2015-06-15 DIAGNOSIS — B9689 Other specified bacterial agents as the cause of diseases classified elsewhere: Secondary | ICD-10-CM

## 2015-06-15 DIAGNOSIS — J019 Acute sinusitis, unspecified: Secondary | ICD-10-CM | POA: Diagnosis not present

## 2015-06-15 MED ORDER — CEFPROZIL 500 MG PO TABS: 500.0000 mg | ORAL_TABLET | Freq: Two times a day (BID) | ORAL | Status: DC

## 2015-06-15 NOTE — Progress Notes (Signed)
   Subjective:    Patient ID: Deanna Larsen, female    DOB: 1951/07/13, 64 y.o.   MRN: [id number]  Cough This is a new problem. The current episode started in the past 7 days. The problem occurs every few minutes. The cough is productive of sputum. Associated symptoms include rhinorrhea. Pertinent negatives include no chest pain, ear pain, fever, shortness of breath or wheezing. Nothing aggravates the symptoms. She has tried OTC cough suppressant for the symptoms.   Patient states no other concerns this visit.   PMH benign Review of Systems  Constitutional: Negative for fever and activity change.  HENT: Positive for congestion and rhinorrhea. Negative for ear pain.   Eyes: Negative for discharge.  Respiratory: Positive for cough. Negative for shortness of breath and wheezing.   Cardiovascular: Negative for chest pain.       Objective:   Physical Exam  Constitutional: She appears well-developed.  HENT:  Head: Normocephalic.  Nose: Nose normal.  Mouth/Throat: Oropharynx is clear and moist. No oropharyngeal exudate.  Neck: Neck supple.  Cardiovascular: Normal rate and normal heart sounds.   No murmur heard. Pulmonary/Chest: Effort normal and breath sounds normal. She has no wheezes.  Lymphadenopathy:    She has no cervical adenopathy.  Skin: Skin is warm and dry.  Nursing note and vitals reviewed.         Assessment & Plan:  Viral syndrome secondary rhinosinusitis antibiotics prescribed warning signs discussed follow-up if problems

## 2015-06-20 ENCOUNTER — Telehealth: Payer: Self-pay | Admitting: Family Medicine

## 2015-06-20 MED ORDER — BENZONATATE 100 MG PO CAPS: 100.0000 mg | ORAL_CAPSULE | Freq: Three times a day (TID) | ORAL | Status: DC | PRN

## 2015-06-20 NOTE — Telephone Encounter (Signed)
Seen 11/25 for cough. Given cefzil and pt states she was asked if she wanted cough med and she said no. She now would like something called in for cough, no fever, no wheezing. Feeling better but wants something for cough.wants something she can take during the day.

## 2015-06-20 NOTE — Telephone Encounter (Signed)
Pt is needing something called in for a cough.     [facility] [place]

## 2015-06-20 NOTE — Telephone Encounter (Signed)
Tessalon 100 mg 1 3 times a day when necessary cough, #30, 1 refill

## 2015-06-20 NOTE — Telephone Encounter (Signed)
Notified patient cough med was sent to pharmacy

## 2015-08-09 ENCOUNTER — Telehealth: Payer: Self-pay | Admitting: Family Medicine

## 2015-08-09 NOTE — Telephone Encounter (Signed)
Pharmacy changed to [facility] in [place] in [place].

## 2015-08-09 NOTE — Telephone Encounter (Signed)
Pt needs her pharmacy to be changed to [facility] please

## 2015-08-27 ENCOUNTER — Ambulatory Visit (INDEPENDENT_AMBULATORY_CARE_PROVIDER_SITE_OTHER): Payer: BLUE CROSS/BLUE SHIELD | Admitting: Family Medicine

## 2015-08-27 ENCOUNTER — Encounter: Payer: Self-pay | Admitting: Family Medicine

## 2015-08-27 VITALS — BP 132/82 | Ht 65.0 in | Wt 208.0 lb

## 2015-08-27 DIAGNOSIS — I10 Essential (primary) hypertension: Secondary | ICD-10-CM | POA: Diagnosis not present

## 2015-08-27 DIAGNOSIS — K219 Gastro-esophageal reflux disease without esophagitis: Secondary | ICD-10-CM | POA: Diagnosis not present

## 2015-08-27 DIAGNOSIS — R079 Chest pain, unspecified: Secondary | ICD-10-CM

## 2015-08-27 DIAGNOSIS — E785 Hyperlipidemia, unspecified: Secondary | ICD-10-CM

## 2015-08-27 DIAGNOSIS — Z79899 Other long term (current) drug therapy: Secondary | ICD-10-CM | POA: Diagnosis not present

## 2015-08-27 DIAGNOSIS — E039 Hypothyroidism, unspecified: Secondary | ICD-10-CM

## 2015-08-27 MED ORDER — SUCRALFATE 1 G PO TABS: 1.0000 g | ORAL_TABLET | Freq: Three times a day (TID) | ORAL | Status: DC

## 2015-08-27 MED ORDER — BENAZEPRIL HCL 20 MG PO TABS: ORAL_TABLET | ORAL | Status: DC

## 2015-08-27 MED ORDER — PANTOPRAZOLE SODIUM 40 MG PO TBEC: DELAYED_RELEASE_TABLET | ORAL | Status: DC

## 2015-08-27 MED ORDER — PRAVASTATIN SODIUM 20 MG PO TABS: 20.0000 mg | ORAL_TABLET | Freq: Every day | ORAL | Status: DC

## 2015-08-27 MED ORDER — LEVOTHYROXINE SODIUM 150 MCG PO TABS: 150.0000 ug | ORAL_TABLET | Freq: Every day | ORAL | Status: DC

## 2015-08-27 NOTE — Progress Notes (Signed)
   Subjective:    Patient ID: Deanna Larsen, female    DOB: 04/25/1951, 65 y.o.   MRN: [id number]  Hypertension This is a chronic problem. The current episode started more than 1 year ago. Risk factors for coronary artery disease include dyslipidemia and post-menopausal state. Treatments tried: lotensin. There are no compliance problems.     Bad acid reflux for several days, burning sensation, worse after eating, some belching and sour taste, woke up with it. ate some onions initially. Pt took a protonix yest which helped a little. When she had a protonic so went away for a few hours and came back. No exertional component. No nausea. No diaphoresis. Patient has had substernal burning with it.    compliant with thyroid medicine. Meds reviewed today. No symptoms of high or low thyroid. Has not missed a dose   compliant with lipid medicine. Prior blood work reviewed. Compliance discussed. Patient worked on fat intake  Review of Systems  no headache no chest pain no back pain no abdominal pain no change in bowel habits ROS otherwise negative    Objective:   Physical Exam   alert vital stable H&T normal lungs clear heart rare rhythm abdomen no significant tenderness   EKG normal sinus rhythm no significant ST-T changes      Assessment & Plan:   impression 1 probable reflux with secondary chest discomfort. Accompanied by belching and sour taste improved with Thoms and not exertional along with normal EKG #2 hypertension good control maintain same meds #3 hyperlipidemia status uncertain maintain same meds for now #4 hypothyroidism discussed plan appropriate blood work. At protonic 7 Carafate. Expect fairly quick improvement of reflux symptoms. If not call back. Diet exercise discussed further recommendations based on blood work [facility]

## 2015-08-28 LAB — LIPID PANEL
CHOL/HDL RATIO: 4 ratio (ref 0.0–4.4)
Cholesterol, Total: 210 mg/dL — ABNORMAL HIGH (ref 100–199)
HDL: 52 mg/dL (ref >39)
LDL Calculated: 134 mg/dL — ABNORMAL HIGH (ref 0–99)
TRIGLYCERIDES: 120 mg/dL (ref 0–149)
VLDL Cholesterol Cal: 24 mg/dL (ref 5–40)

## 2015-08-28 LAB — HEPATIC FUNCTION PANEL
ALT: 22 IU/L (ref 0–32)
AST: 24 IU/L (ref 0–40)
Albumin: 4.5 g/dL (ref 3.6–4.8)
Alkaline Phosphatase: 97 IU/L (ref 39–117)
BILIRUBIN, DIRECT: 0.13 mg/dL (ref 0.00–0.40)
Bilirubin Total: 0.6 mg/dL (ref 0.0–1.2)
TOTAL PROTEIN: 7.1 g/dL (ref 6.0–8.5)

## 2015-08-28 LAB — TSH: TSH: 0.769 u[IU]/mL (ref 0.450–4.500)

## 2015-09-07 MED ORDER — PRAVASTATIN SODIUM 40 MG PO TABS: 40.0000 mg | ORAL_TABLET | Freq: Every day | ORAL | Status: DC

## 2015-09-07 NOTE — Addendum Note (Signed)
Addended by: Margaretha Sheffield on: 09/07/2015 10:24 AM   Modules accepted: Orders

## 2015-09-10 ENCOUNTER — Encounter: Payer: Self-pay | Admitting: Family Medicine

## 2015-12-10 ENCOUNTER — Telehealth: Payer: Self-pay | Admitting: Family Medicine

## 2015-12-10 NOTE — Telephone Encounter (Signed)
Pt dropped off a form to be filled out for a handicap placard. Form in dr office.

## 2015-12-12 NOTE — Telephone Encounter (Signed)
Ready for pick up

## 2015-12-20 ENCOUNTER — Encounter: Payer: Self-pay | Admitting: Family Medicine

## 2015-12-20 ENCOUNTER — Ambulatory Visit (INDEPENDENT_AMBULATORY_CARE_PROVIDER_SITE_OTHER): Payer: BLUE CROSS/BLUE SHIELD | Admitting: Family Medicine

## 2015-12-20 VITALS — BP 120/80 | Temp 98.7°F | Ht 65.0 in | Wt 210.4 lb

## 2015-12-20 DIAGNOSIS — J019 Acute sinusitis, unspecified: Secondary | ICD-10-CM | POA: Diagnosis not present

## 2015-12-20 DIAGNOSIS — B9689 Other specified bacterial agents as the cause of diseases classified elsewhere: Secondary | ICD-10-CM

## 2015-12-20 MED ORDER — CEFPROZIL 500 MG PO TABS: 500.0000 mg | ORAL_TABLET | Freq: Two times a day (BID) | ORAL | Status: DC

## 2015-12-20 MED ORDER — HYDROCODONE-HOMATROPINE 5-1.5 MG/5ML PO SYRP: ORAL_SOLUTION | ORAL | Status: DC

## 2015-12-20 NOTE — Progress Notes (Signed)
   Subjective:    Patient ID: Deanna BaarsDeborah H Lonsway, female    DOB: April 01, 1951, 65 y.o.   MRN: [id number]  Sinusitis This is a new problem. The current episode started in the past 7 days. The problem is unchanged. There has been no fever. The pain is moderate. Associated symptoms include congestion, coughing and ear pain. Treatments tried: tessalon perles. The treatment provided no relief.   Patient with head congestion drainage coughing sinus pressure over the past several days now with aching in the maxillary sinus region PMH benign   Review of Systems  HENT: Positive for congestion and ear pain.   Respiratory: Positive for cough.        Objective:   Physical Exam  Lungs are clear hearts regular mild to moderate sinus pressure and tenderness. Extremities no edema skin warm dry      Assessment & Plan:  Viral illness Secondary rhinosinusitis Antibodies prescribed warning signs discussed Cough medication for nighttime caution drowsiness if causes excessive side effects stop immediately do not drive with medication

## 2016-01-02 ENCOUNTER — Other Ambulatory Visit: Payer: Self-pay | Admitting: Family Medicine

## 2016-01-02 DIAGNOSIS — Z1231 Encounter for screening mammogram for malignant neoplasm of breast: Secondary | ICD-10-CM

## 2016-01-21 ENCOUNTER — Ambulatory Visit (HOSPITAL_COMMUNITY): Payer: 59

## 2016-01-28 ENCOUNTER — Ambulatory Visit (HOSPITAL_COMMUNITY)
Admission: RE | Admit: 2016-01-28 | Discharge: 2016-01-28 | Disposition: A | Discharge status: Home / Self Care | Payer: BLUE CROSS/BLUE SHIELD | Source: Ambulatory Visit | Attending: Family Medicine | Admitting: Family Medicine

## 2016-01-28 ENCOUNTER — Encounter: Payer: Self-pay | Admitting: Family Medicine

## 2016-01-28 ENCOUNTER — Ambulatory Visit (INDEPENDENT_AMBULATORY_CARE_PROVIDER_SITE_OTHER): Payer: BLUE CROSS/BLUE SHIELD | Admitting: Family Medicine

## 2016-01-28 VITALS — BP 114/80 | Temp 98.2°F | Ht 65.0 in | Wt 211.2 lb

## 2016-01-28 DIAGNOSIS — M25562 Pain in left knee: Secondary | ICD-10-CM | POA: Diagnosis not present

## 2016-01-28 DIAGNOSIS — Z1231 Encounter for screening mammogram for malignant neoplasm of breast: Secondary | ICD-10-CM | POA: Insufficient documentation

## 2016-01-28 DIAGNOSIS — R928 Other abnormal and inconclusive findings on diagnostic imaging of breast: Secondary | ICD-10-CM | POA: Diagnosis not present

## 2016-01-28 MED ORDER — MELOXICAM 15 MG PO TABS: 15.0000 mg | ORAL_TABLET | Freq: Every day | ORAL | Status: DC

## 2016-01-28 NOTE — Progress Notes (Signed)
   Subjective:    Patient ID: Deanna Larsen, female    DOB: May 24, 1951, 65 y.o.   MRN: [id number]  Knee Pain  The incident occurred more than 1 week ago. The injury mechanism is unknown. The pain is present in the left knee. The quality of the pain is described as aching. The pain is moderate. She reports no foreign bodies present. The symptoms are aggravated by weight bearing. Treatments tried: meloxicam  The treatment provided no relief.   Patient has no other concerns at this time.   Took advil and aleave, toom meloxicam only one day  Does not seem swollen  Pain ant knee, pain worse getting out of bed or chair   Took a long walk on soft sand developed subsequent pain has limited times. Pain worse in the anterior knee  Review of Systems No headache, no major weight loss or weight gain, no chest pain no back pain abdominal pain no change in bowel habits complete ROS otherwise negative     Objective:   Physical Exam Alert vitals stable, NAD. Blood pressure good on repeat. HEENT normal. Lungs clear. Heart regular rate and rhythm. Left knee no obvious effusion some anterior pain to deep palpation no joint line pain no obvious crepitations no joint laxity       Assessment & Plan:  Impression subacute flare of neck pain left knee and x-ray. Anti-inflammatory medicine prescribed symptom care discussed WSL

## 2016-01-31 ENCOUNTER — Other Ambulatory Visit: Payer: Self-pay | Admitting: Family Medicine

## 2016-01-31 DIAGNOSIS — R928 Other abnormal and inconclusive findings on diagnostic imaging of breast: Secondary | ICD-10-CM

## 2016-02-12 ENCOUNTER — Encounter (HOSPITAL_COMMUNITY): Payer: BLUE CROSS/BLUE SHIELD

## 2016-02-12 ENCOUNTER — Ambulatory Visit (HOSPITAL_COMMUNITY)
Admission: RE | Admit: 2016-02-12 | Discharge: 2016-02-12 | Disposition: A | Discharge status: Home / Self Care | Payer: BLUE CROSS/BLUE SHIELD | Source: Ambulatory Visit | Attending: Family Medicine | Admitting: Family Medicine

## 2016-02-12 ENCOUNTER — Encounter: Payer: Self-pay | Admitting: Family Medicine

## 2016-02-12 ENCOUNTER — Ambulatory Visit (INDEPENDENT_AMBULATORY_CARE_PROVIDER_SITE_OTHER): Payer: BLUE CROSS/BLUE SHIELD | Admitting: Family Medicine

## 2016-02-12 VITALS — BP 130/78 | Ht 65.0 in | Wt 213.0 lb

## 2016-02-12 DIAGNOSIS — M25562 Pain in left knee: Secondary | ICD-10-CM

## 2016-02-12 DIAGNOSIS — R928 Other abnormal and inconclusive findings on diagnostic imaging of breast: Secondary | ICD-10-CM

## 2016-02-12 DIAGNOSIS — N63 Unspecified lump in breast: Secondary | ICD-10-CM | POA: Diagnosis present

## 2016-02-12 NOTE — Progress Notes (Signed)
   Subjective:    Patient ID: Deanna Larsen, female    DOB: 08-02-1950, 65 y.o.   MRN: [id number]  HPI Patient arrives for a follow up on left knee pain. Patient states it is no better and can hardly walk some days  See prior note. Knee has bothered her for several months now. Since going to the beach.  No difficulty since  Intermittent swelling with limping during swelling periods  Modic helped some next  Frustrated about ongoing symptoms  X-ray was negative Review of Systems No headache, no major weight loss or weight gain, no chest pain no back pain abdominal pain no change in bowel habits complete ROS otherwise negative     Objective:   Physical Exam Alert vital stable lungs clear heart rare rhythm left knee effusion evident lateral joint line pain minimal crepitations no joint laxity       Assessment & Plan:  #1 worsening knee pain with evidence of inflammation effusion plan offered injection patient declines. Would like to see orthopedic discussed will schedule

## 2016-02-13 ENCOUNTER — Encounter: Payer: Self-pay | Admitting: Family Medicine

## 2016-02-22 ENCOUNTER — Telehealth: Payer: Self-pay | Admitting: Family Medicine

## 2016-02-22 DIAGNOSIS — E039 Hypothyroidism, unspecified: Secondary | ICD-10-CM

## 2016-02-22 DIAGNOSIS — E785 Hyperlipidemia, unspecified: Secondary | ICD-10-CM

## 2016-02-22 DIAGNOSIS — I10 Essential (primary) hypertension: Secondary | ICD-10-CM

## 2016-02-22 NOTE — Telephone Encounter (Signed)
Blood work ordered in EPIC. Patient notified. 

## 2016-02-22 NOTE — Telephone Encounter (Signed)
Lip liv m7 tsh 

## 2016-02-22 NOTE — Telephone Encounter (Signed)
Pt is requesting lab orders to be sent over. Last labs per epic were: Hepatic,lipid and tsh on 08/27/15

## 2016-02-27 LAB — HEPATIC FUNCTION PANEL
ALBUMIN: 4.3 g/dL (ref 3.6–4.8)
ALT: 23 IU/L (ref 0–32)
AST: 22 IU/L (ref 0–40)
Alkaline Phosphatase: 109 IU/L (ref 39–117)
BILIRUBIN, DIRECT: 0.12 mg/dL (ref 0.00–0.40)
Bilirubin Total: 0.6 mg/dL (ref 0.0–1.2)
Total Protein: 7.1 g/dL (ref 6.0–8.5)

## 2016-02-27 LAB — BASIC METABOLIC PANEL
BUN/Creatinine Ratio: 13 (ref 12–28)
BUN: 13 mg/dL (ref 8–27)
CALCIUM: 10.3 mg/dL (ref 8.7–10.3)
CO2: 22 mmol/L (ref 18–29)
CREATININE: 1.02 mg/dL — AB (ref 0.57–1.00)
Chloride: 103 mmol/L (ref 96–106)
GFR, EST AFRICAN AMERICAN: 67 mL/min/{1.73_m2} (ref >59)
GFR, EST NON AFRICAN AMERICAN: 58 mL/min/{1.73_m2} — AB (ref >59)
Glucose: 91 mg/dL (ref 65–99)
POTASSIUM: 4.9 mmol/L (ref 3.5–5.2)
Sodium: 145 mmol/L — ABNORMAL HIGH (ref 134–144)

## 2016-02-27 LAB — LIPID PANEL
CHOL/HDL RATIO: 3.5 ratio (ref 0.0–4.4)
Cholesterol, Total: 194 mg/dL (ref 100–199)
HDL: 55 mg/dL (ref >39)
LDL Calculated: 116 mg/dL — ABNORMAL HIGH (ref 0–99)
TRIGLYCERIDES: 113 mg/dL (ref 0–149)
VLDL Cholesterol Cal: 23 mg/dL (ref 5–40)

## 2016-02-27 LAB — TSH: TSH: 2.39 u[IU]/mL (ref 0.450–4.500)

## 2016-03-06 ENCOUNTER — Other Ambulatory Visit: Payer: Self-pay | Admitting: Family Medicine

## 2016-03-11 ENCOUNTER — Ambulatory Visit (INDEPENDENT_AMBULATORY_CARE_PROVIDER_SITE_OTHER): Payer: BLUE CROSS/BLUE SHIELD | Admitting: Family Medicine

## 2016-03-11 ENCOUNTER — Encounter: Payer: Self-pay | Admitting: Family Medicine

## 2016-03-11 VITALS — BP 130/80 | Ht 65.0 in | Wt 208.2 lb

## 2016-03-11 DIAGNOSIS — M542 Cervicalgia: Secondary | ICD-10-CM | POA: Diagnosis not present

## 2016-03-11 DIAGNOSIS — E785 Hyperlipidemia, unspecified: Secondary | ICD-10-CM

## 2016-03-11 DIAGNOSIS — E039 Hypothyroidism, unspecified: Secondary | ICD-10-CM

## 2016-03-11 DIAGNOSIS — I10 Essential (primary) hypertension: Secondary | ICD-10-CM | POA: Diagnosis not present

## 2016-03-11 MED ORDER — PRAVASTATIN SODIUM 40 MG PO TABS: 40.0000 mg | ORAL_TABLET | Freq: Every day | ORAL | 1 refills | Status: DC

## 2016-03-11 MED ORDER — PANTOPRAZOLE SODIUM 40 MG PO TBEC: DELAYED_RELEASE_TABLET | ORAL | 1 refills | Status: DC

## 2016-03-11 MED ORDER — BENAZEPRIL HCL 20 MG PO TABS: ORAL_TABLET | ORAL | 1 refills | Status: DC

## 2016-03-11 MED ORDER — LEVOTHYROXINE SODIUM 150 MCG PO TABS: ORAL_TABLET | ORAL | 1 refills | Status: DC

## 2016-03-11 NOTE — Progress Notes (Signed)
   Subjective:    Patient ID: Deanna Larsen, female    DOB: [birth year], 65 y.o.   MRN: [id number]  Hypertension  This is a chronic problem. The current episode started more than 1 year ago. Risk factors for coronary artery disease include dyslipidemia and post-menopausal state. Treatments tried: benazepril. There are no compliance problems.    Discuss blood work results Results for orders placed or performed in visit on 02/22/16  Lipid panel  Result Value Ref Range   Cholesterol, Total 194 100 - 199 mg/dL   Triglycerides 409113 0 - 149 mg/dL   HDL 55 >81>39 mg/dL   VLDL Cholesterol Cal 23 5 - 40 mg/dL   LDL Calculated 191116 (H) 0 - 99 mg/dL   Chol/HDL Ratio 3.5 0.0 - 4.4 ratio units  Hepatic function panel  Result Value Ref Range   Total Protein 7.1 6.0 - 8.5 g/dL   Albumin 4.3 3.6 - 4.8 g/dL   Bilirubin Total 0.6 0.0 - 1.2 mg/dL   Bilirubin, Direct 4.780.12 0.00 - 0.40 mg/dL   Alkaline Phosphatase 109 39 - 117 IU/L   AST 22 0 - 40 IU/L   ALT 23 0 - 32 IU/L  TSH  Result Value Ref Range   TSH 2.390 0.450 - 4.500 uIU/mL  Basic metabolic panel  Result Value Ref Range   Glucose 91 65 - 99 mg/dL   BUN 13 8 - 27 mg/dL   Creatinine, Ser 2.951.02 (H) 0.57 - 1.00 mg/dL   GFR calc non Af Amer 58 (L) >59 mL/min/1.73   GFR calc Af Amer 67 >59 mL/min/1.73   BUN/Creatinine Ratio 13 12 - 28   Sodium 145 (H) 134 - 144 mmol/L   Potassium 4.9 3.5 - 5.2 mmol/L   Chloride 103 96 - 106 mmol/L   CO2 22 18 - 29 mmol/L   Calcium 10.3 8.7 - 10.3 mg/dL   Patient continues to take lipid medication regularly. No obvious side effects from it. Generally does not miss a dose. Prior blood work results are reviewed with patient. Patient continues to work on fat intake in diet  Blood pressure medicine and blood pressure levels reviewed today with patient. Compliant with blood pressure medicine. States does not miss a dose. No obvious side effects. Blood pressure generally good when checked elsewhere. Watching salt  intake.  Thyr med compliant, no symptoms of high or low thyroid  Reflux overall stable, states needs the med. Watching diet in this regard   Notes pain right lat neck, worse at night Aches at times, not during the dy, rad from ear to lower neck o left side   Review of Systems No headache, no major weight loss or weight gain, no chest pain no back pain abdominal pain no change in bowel habits complete ROS otherwise negative     Objective:   Physical Exam  Alert vitals stable thyroid nonpalpable right lateral sternocleidomastoid tenderness to deep palpation good range of motion lungs clear. Heart regular in rhythm.      Assessment & Plan:  Impression 1 hypertension discussed good control to maintain same #2 right lateral neck pain highly doubt serious etiology no further workup symptom care only #3 hyperlipidemia good control discussed maintain same #4 hypothyroidism good control discussed maintain same #5 reflux with ongoing need for meds clarified plan all medications refilled. Diet exercise discussed. Blood work reviewed as noted. WSL recheck in 6 months

## 2016-04-08 ENCOUNTER — Encounter: Payer: Self-pay | Admitting: Family Medicine

## 2016-04-08 ENCOUNTER — Ambulatory Visit (INDEPENDENT_AMBULATORY_CARE_PROVIDER_SITE_OTHER): Payer: BLUE CROSS/BLUE SHIELD | Admitting: Family Medicine

## 2016-04-08 VITALS — BP 120/72 | Temp 98.3°F | Ht 65.0 in | Wt 209.5 lb

## 2016-04-08 DIAGNOSIS — R300 Dysuria: Secondary | ICD-10-CM | POA: Diagnosis not present

## 2016-04-08 DIAGNOSIS — N3000 Acute cystitis without hematuria: Secondary | ICD-10-CM

## 2016-04-08 LAB — POCT URINALYSIS DIPSTICK
Blood, UA: 50
PH UA: 5
PROTEIN UA: 30
Spec Grav, UA: 1.02

## 2016-04-08 MED ORDER — CEFPROZIL 500 MG PO TABS: 500.0000 mg | ORAL_TABLET | Freq: Two times a day (BID) | ORAL | 0 refills | Status: DC

## 2016-04-08 NOTE — Progress Notes (Signed)
   Subjective:    Patient ID: Deanna BaarsDeborah H Mcgivern, female    DOB: 10-06-1950, 65 y.o.   MRN: [id number]  HPI    Review of Systems     Objective:   Physical Exam        Assessment & Plan:

## 2016-04-08 NOTE — Progress Notes (Signed)
   Subjective:    Patient ID: Deanna Larsen, female    DOB: 1951-06-29, 65 y.o.   MRN: [id number]  Urinary Tract Infection   This is a new problem. The current episode started in the past 7 days. The problem occurs intermittently. The problem has been unchanged. The quality of the pain is described as burning. The pain is moderate. Associated symptoms include flank pain. She has tried increased fluids for the symptoms. The treatment provided no relief.      Review of Systems  Genitourinary: Positive for flank pain.  She denies hematuria relates a little bit of lower pelvic pressure dysuria. Denies sweats chills denies nausea vomiting diarrhea     Objective:   Physical Exam  Lungs clear heart regular patient not toxic flanks nontender low back subjective tenderness lower abdomen nontender UA with wbc's TNTC as well as a few RBCs      Assessment & Plan:  Culture sent Antibiotics prescribed May use OTC measures for dysuria If high fevers chills or worse follow-up immediately here or ER

## 2016-04-10 LAB — URINE CULTURE

## 2016-04-25 ENCOUNTER — Ambulatory Visit (INDEPENDENT_AMBULATORY_CARE_PROVIDER_SITE_OTHER): Payer: Medicare Other | Admitting: Family Medicine

## 2016-04-25 ENCOUNTER — Encounter: Payer: Self-pay | Admitting: Family Medicine

## 2016-04-25 VITALS — Temp 98.3°F | Ht 65.0 in | Wt 208.8 lb

## 2016-04-25 DIAGNOSIS — N3000 Acute cystitis without hematuria: Secondary | ICD-10-CM

## 2016-04-25 DIAGNOSIS — R300 Dysuria: Secondary | ICD-10-CM | POA: Diagnosis not present

## 2016-04-25 LAB — POCT URINALYSIS DIPSTICK
PH UA: 6
Spec Grav, UA: 1.02

## 2016-04-25 MED ORDER — CEPHALEXIN 500 MG PO CAPS: 500.0000 mg | ORAL_CAPSULE | Freq: Four times a day (QID) | ORAL | 0 refills | Status: DC

## 2016-04-25 NOTE — Progress Notes (Signed)
   Subjective:    Patient ID: Deanna BaarsDeborah H Koloski, female    DOB: Jan 24, 1951, 65 y.o.   MRN: [id number]  HPI Patient arrives with c/o continued dysuria. Patient was seen in September and given cefzil and had culture but she is still having issues. Patient will lower abdominal discomfort dysuria urinary from C recently treated with antibiotics did not clear up states it seems to be getting worse denies high fever chills sweats  Review of Systems See above.    Objective:   Physical Exam  Lungs clear hearts regular flanks nontender lower abdominal area mild tenderness no guarding rebound urinalysis with wbc's TNTC      Assessment & Plan:  UTI-antibiotics prescribed-culture sent-should gradually get better warning signs discussed follow-up if problems

## 2016-04-27 LAB — URINE CULTURE

## 2016-05-05 ENCOUNTER — Telehealth: Payer: Self-pay | Admitting: Family Medicine

## 2016-05-05 MED ORDER — CIPROFLOXACIN HCL 500 MG PO TABS: ORAL_TABLET | ORAL | 0 refills | Status: DC

## 2016-05-05 NOTE — Telephone Encounter (Signed)
Spoke with patient and informed her per Dr.Steve Luking- cipro 500 bid seven days, use advil prn pain, check in office if worsens or persist, Dr.Steve  knows Dr Lorin PicketScott did a follow up visit recommended already so follow up  then as already advised. Patient verbalized understanding.

## 2016-05-05 NOTE — Telephone Encounter (Signed)
Pt called stating that the urinary tract symptoms have gotten better, however she is know experiencing extreme low back pain in her kidney area. Pt is still taking the medication prescribed on the 6th. Pt is wanting to know what she should do. Please advise.

## 2016-05-05 NOTE — Telephone Encounter (Signed)
Spoke with patient and patient denies fever and stated that she is experiencing low back pain. Denies fever. Has two days left off prescribed antibiotics. Please advise?

## 2016-05-05 NOTE — Telephone Encounter (Signed)
cipro 500 bid seven days, use advil prn pain, ck in office if worsens or persist, I know dr Lorin Picketscott did a fu visit rec already so f u then as already advised

## 2016-05-14 ENCOUNTER — Ambulatory Visit (INDEPENDENT_AMBULATORY_CARE_PROVIDER_SITE_OTHER): Payer: Medicare Other | Admitting: Family Medicine

## 2016-05-14 ENCOUNTER — Encounter: Payer: Self-pay | Admitting: Family Medicine

## 2016-05-14 VITALS — BP 132/88 | Temp 98.7°F | Ht 65.0 in | Wt 210.0 lb

## 2016-05-14 DIAGNOSIS — M545 Low back pain, unspecified: Secondary | ICD-10-CM

## 2016-05-14 DIAGNOSIS — N390 Urinary tract infection, site not specified: Secondary | ICD-10-CM

## 2016-05-14 DIAGNOSIS — Z23 Encounter for immunization: Secondary | ICD-10-CM | POA: Diagnosis not present

## 2016-05-14 LAB — POCT URINALYSIS DIPSTICK
PH UA: 5
SPEC GRAV UA: 1.02

## 2016-05-14 NOTE — Patient Instructions (Signed)

## 2016-05-14 NOTE — Progress Notes (Signed)
   Subjective:    Patient ID: Deanna Larsen, female    DOB: 1950-10-29, 65 y.o.   MRN: [id number]  Urinary Tract Infection   Chronicity: follow up has finished three antibiotics. Associated symptoms comments: Low back pain on right side.  worse with motions, no sig radiation  Results for orders placed or performed in visit on 05/14/16  POCT urinalysis dipstick  Result Value Ref Range   Color, UA     Clarity, UA     Glucose, UA     Bilirubin, UA ++    Ketones, UA     Spec Grav, UA 1.020    Blood, UA     pH, UA 5.0    Protein, UA     Urobilinogen, UA     Nitrite, UA     Leukocytes, UA  Negative  Patient notes right lumbar pain. Worse with movement. Minimal radiation. Comes and goes. Wonders if associated with urinary tract history  Flu vaccine today.   Review of Systems No headache, no major weight loss or weight gain, no chest pain no back pain abdominal pain no change in bowel habits complete ROS otherwise negative     Objective:   Physical Exam Alert vitals stable, NAD. Blood pressure good on repeat. HEENT normal. Lungs clear. Heart regular rate and rhythm. Negative straight leg raise bilateral some tenderness right lower lumbar region no CVA tenderness  Urinalysis unremarkable       Assessment & Plan:  Impression 1 UTI resolved #2 lumbar strain discussed unrelated to #1 plan exercises discussed and given. Symptom care discussed WSL flu shot today

## 2016-05-15 ENCOUNTER — Other Ambulatory Visit: Payer: Self-pay | Admitting: Family Medicine

## 2016-09-04 ENCOUNTER — Other Ambulatory Visit: Payer: Self-pay | Admitting: Family Medicine

## 2016-09-05 ENCOUNTER — Other Ambulatory Visit: Payer: Self-pay | Admitting: Family Medicine

## 2016-09-06 ENCOUNTER — Other Ambulatory Visit: Payer: Self-pay | Admitting: Family Medicine

## 2016-09-08 ENCOUNTER — Encounter: Payer: Self-pay | Admitting: Family Medicine

## 2016-09-08 ENCOUNTER — Other Ambulatory Visit: Payer: Self-pay | Admitting: Family Medicine

## 2016-09-08 ENCOUNTER — Ambulatory Visit (INDEPENDENT_AMBULATORY_CARE_PROVIDER_SITE_OTHER): Payer: Medicare Other | Admitting: Family Medicine

## 2016-09-08 ENCOUNTER — Ambulatory Visit (HOSPITAL_COMMUNITY)
Admission: RE | Admit: 2016-09-08 | Discharge: 2016-09-08 | Disposition: A | Discharge status: Home / Self Care | Payer: Medicare Other | Source: Ambulatory Visit | Attending: Family Medicine | Admitting: Family Medicine

## 2016-09-08 VITALS — BP 130/88 | Ht 65.0 in | Wt 209.0 lb

## 2016-09-08 DIAGNOSIS — M545 Low back pain: Secondary | ICD-10-CM | POA: Diagnosis not present

## 2016-09-08 DIAGNOSIS — E785 Hyperlipidemia, unspecified: Secondary | ICD-10-CM | POA: Diagnosis not present

## 2016-09-08 DIAGNOSIS — Z1211 Encounter for screening for malignant neoplasm of colon: Secondary | ICD-10-CM

## 2016-09-08 DIAGNOSIS — I700 Atherosclerosis of aorta: Secondary | ICD-10-CM | POA: Diagnosis not present

## 2016-09-08 DIAGNOSIS — E039 Hypothyroidism, unspecified: Secondary | ICD-10-CM | POA: Diagnosis not present

## 2016-09-08 DIAGNOSIS — M549 Dorsalgia, unspecified: Secondary | ICD-10-CM

## 2016-09-08 DIAGNOSIS — Z79899 Other long term (current) drug therapy: Secondary | ICD-10-CM

## 2016-09-08 DIAGNOSIS — M5137 Other intervertebral disc degeneration, lumbosacral region: Secondary | ICD-10-CM | POA: Diagnosis not present

## 2016-09-08 LAB — POCT URINALYSIS DIPSTICK
Blood, UA: 10
Spec Grav, UA: 1.015
pH, UA: 5

## 2016-09-08 MED ORDER — LEVOTHYROXINE SODIUM 150 MCG PO TABS: 150.0000 ug | ORAL_TABLET | Freq: Every day | ORAL | 1 refills | Status: DC

## 2016-09-08 MED ORDER — BENAZEPRIL HCL 20 MG PO TABS: 30.0000 mg | ORAL_TABLET | Freq: Every day | ORAL | 1 refills | Status: DC

## 2016-09-08 MED ORDER — TIZANIDINE HCL 4 MG PO TABS: 4.0000 mg | ORAL_TABLET | Freq: Two times a day (BID) | ORAL | 1 refills | Status: DC | PRN

## 2016-09-08 MED ORDER — PANTOPRAZOLE SODIUM 40 MG PO TBEC: DELAYED_RELEASE_TABLET | ORAL | 1 refills | Status: DC

## 2016-09-08 MED ORDER — PRAVASTATIN SODIUM 40 MG PO TABS: ORAL_TABLET | ORAL | 1 refills | Status: DC

## 2016-09-08 MED ORDER — DICLOFENAC SODIUM 75 MG PO TBEC: 75.0000 mg | DELAYED_RELEASE_TABLET | Freq: Two times a day (BID) | ORAL | 1 refills | Status: DC | PRN

## 2016-09-08 NOTE — Telephone Encounter (Signed)
No, prn only for now, do not want to rx that much needs to see if it helps and how often she takes

## 2016-09-08 NOTE — Progress Notes (Signed)
   Subjective:    Patient ID: Deanna Larsen, female    DOB: 29-Jun-1951, 66 y.o.   MRN: [id number]  Hypertension  This is a chronic problem. The current episode started more than 1 year ago. The problem has been gradually improving since onset. There are no associated agents to hypertension. There are no known risk factors for coronary artery disease. Treatments tried: benazepril. The current treatment provides moderate improvement. There are no compliance problems.    Patient is still having left sided low back pain. Patient wants to discuss this today also.   Patient continues to take lipid medication regularly. No obvious side effects from it. Generally does not miss a dose. Prior blood work results are reviewed with patient. Patient continues to work on fat intake in diet  Blood pressure medicine and blood pressure levels reviewed today with patient. Compliant with blood pressure medicine. States does not miss a dose. No obvious side effects. Blood pressure generally good when checked elsewhere. Watching salt intake. . No symptoms of high or low thyroid. No fatigue. Compliant with thyroid medication. Compliant with thyroid med   Left post lumbar pain, tends to occur at any time, at times uses a heating pad,   Hurts worse with bending over, ises ibuprofen or aleave  No awakienign , most of the time hits when gets up and start moving     Review of Systems No headache, no major weight loss or weight gain, no chest pain no back pain abdominal pain no change in bowel habits complete ROS otherwise negative     Objective:   Physical Exam Alert vitals stable, NAD. Blood pressure good on repeat. HEENT normal. Lungs clear. Heart regular rate and rhythm. Thyroid nonpalpable low back no acute tenderness to percussion negative straight leg raise       Assessment & Plan:  Impression 1 hypertension good control discussed maintain same meds #2 hyperlipidemia prior blood work discussed  maintain same meds pending the results #3 hypothyroidism clinically stable maintain same pending thyroid #4 low back pain subacute recommend x-rays local measures and exercise also discussed in encourage

## 2016-09-09 ENCOUNTER — Encounter: Payer: Self-pay | Admitting: Family Medicine

## 2016-09-09 DIAGNOSIS — E785 Hyperlipidemia, unspecified: Secondary | ICD-10-CM | POA: Diagnosis not present

## 2016-09-09 DIAGNOSIS — E039 Hypothyroidism, unspecified: Secondary | ICD-10-CM | POA: Diagnosis not present

## 2016-09-09 DIAGNOSIS — Z79899 Other long term (current) drug therapy: Secondary | ICD-10-CM | POA: Diagnosis not present

## 2016-09-10 ENCOUNTER — Encounter: Payer: Self-pay | Admitting: Family Medicine

## 2016-09-10 LAB — LIPID PANEL
Chol/HDL Ratio: 3.4 (ref 0.0–4.4)
Cholesterol, Total: 167 mg/dL (ref 100–199)
HDL: 49 mg/dL (ref >39)
LDL Calculated: 93 (ref 0–99)
TRIGLYCERIDES: 127 mg/dL (ref 0–149)
VLDL Cholesterol Cal: 25 (ref 5–40)

## 2016-09-10 LAB — HEPATIC FUNCTION PANEL
ALT: 35 IU/L — ABNORMAL HIGH (ref 0–32)
AST: 34 IU/L (ref 0–40)
Albumin: 4.3 g/dL (ref 3.6–4.8)
Alkaline Phosphatase: 97 IU/L (ref 39–117)
BILIRUBIN, DIRECT: 0.17 mg/dL (ref 0.00–0.40)
Bilirubin Total: 0.6 mg/dL (ref 0.0–1.2)
Total Protein: 6.9 g/dL (ref 6.0–8.5)

## 2016-09-10 LAB — TSH: TSH: 0.728 u[IU]/mL (ref 0.450–4.500)

## 2016-09-22 ENCOUNTER — Telehealth: Payer: Self-pay

## 2016-09-22 NOTE — Telephone Encounter (Signed)
[id number] patient received letter to schedule tcs

## 2016-09-22 NOTE — Telephone Encounter (Signed)
Tried to call with no answer  

## 2016-09-23 ENCOUNTER — Other Ambulatory Visit: Payer: Self-pay

## 2016-09-23 DIAGNOSIS — Z1211 Encounter for screening for malignant neoplasm of colon: Secondary | ICD-10-CM

## 2016-09-23 MED ORDER — PEG 3350-KCL-NA BICARB-NACL 420 G PO SOLR: 4000.0000 mL | ORAL | 0 refills | Status: DC

## 2016-09-23 NOTE — Addendum Note (Signed)
Addended by: Tiffany KocherLEWIS, Seabron Iannello S on: 09/23/2016 12:57 PM   Modules accepted: Orders

## 2016-09-23 NOTE — Telephone Encounter (Signed)
Ok to schedule.

## 2016-09-23 NOTE — Telephone Encounter (Addendum)
Gastroenterology Pre-Procedure Review  Request Date: Requesting Physician:   PATIENT REVIEW QUESTIONS: The patient responded to the following health history questions as indicated:    1. Diabetes Melitis: NO 2. Joint replacements in the past 12 months: NO 3. Major health problems in the past 3 months: NO 4. Has an artificial valve or MVP: NO 5. Has a defibrillator: NO 6. Has been advised in past to take antibiotics in advance of a procedure like teeth cleaning: NO 7. Family history of colon cancer: NO 8. Alcohol Use: NO 9. History of sleep apnea: NO 10. History of coronary artery or other vascular stents placed within the last 12 months: NO    MEDICATIONS & ALLERGIES:    Patient reports the following regarding taking any blood thinners:   Plavix? NO Aspirin? NO Coumadin? NO Brilinta? NO Xarelto? NO Eliquis? NO Pradaxa? NO Savaysa? NO Effient? NO  Patient confirms/reports the following medications:  Current Outpatient Prescriptions  Medication Sig Dispense Refill  . benazepril (LOTENSIN) 20 MG tablet TAKE ONE & ONE-HALF TABLETS BY MOUTH ONCE DAILY 135 tablet 1  . levothyroxine (SYNTHROID, LEVOTHROID) 150 MCG tablet Take 1 tablet (150 mcg total) by mouth daily. 90 tablet 1  . pantoprazole (PROTONIX) 40 MG tablet TAKE 1 TABLET BY MOUTH ONCE DAILY AS NEEDED FOR ACID REFLUX 90 tablet 1  . pravastatin (PRAVACHOL) 40 MG tablet TAKE 1 TABLET(40 MG) BY MOUTH DAILY 90 tablet 1  . tiZANidine (ZANAFLEX) 4 MG tablet Take 1 tablet (4 mg total) by mouth 2 (two) times daily as needed for muscle spasms. 28 tablet 1   No current facility-administered medications for this visit.     Patient confirms/reports the following allergies:  Allergies  Allergen Reactions  . Augmentin [Amoxicillin-Pot Clavulanate] Nausea And Vomiting    vomiting    No orders of the defined types were placed in this encounter.   AUTHORIZATION INFORMATION Primary Insurance: MEDICARE  ID #: [id number],  Group  #:  Pre-Cert / Auth required:  Pre-Cert / Auth #:   Secondary Insurance: [employer],  ID #: [id number],  Group #:  Pre-Cert / Auth required:  Pre-Cert / Auth #:   SCHEDULE INFORMATION: Procedure has been scheduled as follows:  Date: , Time:   Location:   This Gastroenterology Pre-Precedure Review Form is being routed to the following provider(s): [facility]

## 2016-09-23 NOTE — Telephone Encounter (Signed)
NO PA is needed 

## 2016-09-23 NOTE — Addendum Note (Signed)
Addended by: Kennis CarinaFARRIS, Arrie Zuercher L on: 09/23/2016 12:00 PM   Modules accepted: Orders

## 2016-09-24 NOTE — Telephone Encounter (Signed)
Pt is set up for TCS on 10/16/16 @ 9:30 am. Instructions are in the mail

## 2016-10-16 ENCOUNTER — Encounter (HOSPITAL_COMMUNITY): Payer: Self-pay | Admitting: *Deleted

## 2016-10-16 ENCOUNTER — Encounter (HOSPITAL_COMMUNITY)
Admission: RE | Disposition: A | Discharge status: Home / Self Care | Payer: Self-pay | Source: Ambulatory Visit | Attending: Gastroenterology

## 2016-10-16 ENCOUNTER — Ambulatory Visit (HOSPITAL_COMMUNITY)
Admission: RE | Admit: 2016-10-16 | Discharge: 2016-10-16 | Disposition: A | Discharge status: Home / Self Care | Payer: Medicare Other | Source: Ambulatory Visit | Attending: Gastroenterology | Admitting: Gastroenterology

## 2016-10-16 DIAGNOSIS — E785 Hyperlipidemia, unspecified: Secondary | ICD-10-CM | POA: Insufficient documentation

## 2016-10-16 DIAGNOSIS — Z1211 Encounter for screening for malignant neoplasm of colon: Secondary | ICD-10-CM | POA: Insufficient documentation

## 2016-10-16 DIAGNOSIS — E039 Hypothyroidism, unspecified: Secondary | ICD-10-CM | POA: Insufficient documentation

## 2016-10-16 DIAGNOSIS — M1990 Unspecified osteoarthritis, unspecified site: Secondary | ICD-10-CM | POA: Insufficient documentation

## 2016-10-16 DIAGNOSIS — K5730 Diverticulosis of large intestine without perforation or abscess without bleeding: Secondary | ICD-10-CM | POA: Diagnosis not present

## 2016-10-16 DIAGNOSIS — Z87891 Personal history of nicotine dependence: Secondary | ICD-10-CM | POA: Insufficient documentation

## 2016-10-16 DIAGNOSIS — K648 Other hemorrhoids: Secondary | ICD-10-CM | POA: Diagnosis not present

## 2016-10-16 DIAGNOSIS — Z79899 Other long term (current) drug therapy: Secondary | ICD-10-CM | POA: Insufficient documentation

## 2016-10-16 DIAGNOSIS — K56609 Unspecified intestinal obstruction, unspecified as to partial versus complete obstruction: Secondary | ICD-10-CM | POA: Diagnosis not present

## 2016-10-16 DIAGNOSIS — I10 Essential (primary) hypertension: Secondary | ICD-10-CM | POA: Diagnosis not present

## 2016-10-16 DIAGNOSIS — K589 Irritable bowel syndrome without diarrhea: Secondary | ICD-10-CM | POA: Diagnosis not present

## 2016-10-16 DIAGNOSIS — K219 Gastro-esophageal reflux disease without esophagitis: Secondary | ICD-10-CM | POA: Diagnosis not present

## 2016-10-16 DIAGNOSIS — Z1212 Encounter for screening for malignant neoplasm of rectum: Secondary | ICD-10-CM | POA: Diagnosis not present

## 2016-10-16 HISTORY — PX: COLONOSCOPY: SHX5424

## 2016-10-16 SURGERY — COLONOSCOPY
Anesthesia: Moderate Sedation

## 2016-10-16 MED ORDER — MEPERIDINE HCL 100 MG/ML IJ SOLN
INTRAMUSCULAR | Status: AC
  Filled 2016-10-16: qty 2

## 2016-10-16 MED ORDER — MEPERIDINE HCL 100 MG/ML IJ SOLN
INTRAMUSCULAR | Status: DC | PRN
  Administered 2016-10-16: 25 mg via INTRAVENOUS
  Administered 2016-10-16: 50 mg via INTRAVENOUS

## 2016-10-16 MED ORDER — MIDAZOLAM HCL 5 MG/5ML IJ SOLN
INTRAMUSCULAR | Status: AC
  Filled 2016-10-16: qty 10

## 2016-10-16 MED ORDER — MIDAZOLAM HCL 5 MG/5ML IJ SOLN
INTRAMUSCULAR | Status: DC | PRN
  Administered 2016-10-16: 2 mg via INTRAVENOUS
  Administered 2016-10-16: 1 mg via INTRAVENOUS
  Administered 2016-10-16: 2 mg via INTRAVENOUS

## 2016-10-16 MED ORDER — ONDANSETRON HCL 4 MG/2ML IJ SOLN
4.0000 mg | Freq: Once | INTRAMUSCULAR | Status: AC
Start: 2016-10-16 — End: 2016-10-16
  Administered 2016-10-16: 4 mg via INTRAVENOUS

## 2016-10-16 MED ORDER — SODIUM CHLORIDE 0.9 % IV SOLN
INTRAVENOUS | Status: DC
  Administered 2016-10-16: 1000 mL via INTRAVENOUS

## 2016-10-16 MED ORDER — ONDANSETRON HCL 4 MG/2ML IJ SOLN
INTRAMUSCULAR | Status: AC
  Filled 2016-10-16: qty 2

## 2016-10-16 MED ORDER — STERILE WATER FOR IRRIGATION IR SOLN
Status: DC | PRN
  Administered 2016-10-16: 2.5 mL

## 2016-10-16 NOTE — H&P (Signed)
Primary Care Physician:  Lubertha South, MD Primary Gastroenterologist:  Dr. Darrick Penna  Pre-Procedure History & Physical: HPI:  Deanna Larsen is a 66 y.o. female here for COLON CANCER SCREENING.  Past Medical History:  Diagnosis Date  . Acid reflux   . Arthritis   . Hyperlipidemia   . Hypertension   . Hypothyroid   . IBS (irritable bowel syndrome)     Past Surgical History:  Procedure Laterality Date  . ABDOMINAL HYSTERECTOMY    . BILATERAL OOPHORECTOMY    . CHOLECYSTECTOMY    . COLONOSCOPY    . HIP SURGERY Right     Prior to Admission medications   Medication Sig Start Date End Date Taking? Authorizing Provider  benazepril (LOTENSIN) 20 MG tablet TAKE ONE & ONE-HALF TABLETS BY MOUTH ONCE DAILY Patient taking differently: Take 30 mg by mouth at bedtime.  03/11/16  Yes Merlyn Albert, MD  Chlorpheniramine Maleate (ALLERGY PO) Take 1 tablet by mouth daily as needed (allergies).   Yes Historical Provider, MD  ibuprofen (ADVIL,MOTRIN) 200 MG tablet Take 600 mg by mouth daily as needed for headache.   Yes Historical Provider, MD  levothyroxine (SYNTHROID, LEVOTHROID) 150 MCG tablet Take 1 tablet (150 mcg total) by mouth daily. Patient taking differently: Take 150 mcg by mouth daily before breakfast.  09/08/16  Yes Merlyn Albert, MD  pantoprazole (PROTONIX) 40 MG tablet TAKE 1 TABLET BY MOUTH ONCE DAILY AS NEEDED FOR ACID REFLUX Patient taking differently: Take 40 mg by mouth daily.  09/08/16  Yes Merlyn Albert, MD  polyethylene glycol-electrolytes (TRILYTE) 420 g solution Take 4,000 mLs by mouth as directed. 09/23/16  Yes West Bali, MD  pravastatin (PRAVACHOL) 40 MG tablet TAKE 1 TABLET(40 MG) BY MOUTH DAILY Patient taking differently: Take 40 mg by mouth at bedtime.  09/08/16  Yes Merlyn Albert, MD  diclofenac (VOLTAREN) 75 MG EC tablet Take 75 mg by mouth daily as needed for mild pain.    Historical Provider, MD  tiZANidine (ZANAFLEX) 4 MG tablet Take 1 tablet (4 mg  total) by mouth 2 (two) times daily as needed for muscle spasms. Patient not taking: Reported on 10/13/2016 09/08/16   Merlyn Albert, MD    Allergies as of 09/23/2016 - Review Complete 09/23/2016  Allergen Reaction Noted  . Augmentin [amoxicillin-pot clavulanate] Nausea And Vomiting 08/15/2014    Family History  Problem Relation Age of Onset  . Heart disease Father   . Diabetes Brother     Social History   Social History  . Marital status: Widowed    Spouse name: N/A  . Number of children: N/A  . Years of education: N/A   Occupational History  . Not on file.   Social History Main Topics  . Smoking status: Former Smoker    Packs/day: 1.50    Years: 20.00    Quit date: 09/21/2005  . Smokeless tobacco: Never Used  . Alcohol use No  . Drug use: No  . Sexual activity: Not Currently    Birth control/ protection: Surgical, Post-menopausal   Other Topics Concern  . Not on file   Social History Narrative  . No narrative on file    Review of Systems: See HPI, otherwise negative ROS   Physical Exam: BP (!) 145/73   Pulse 74   Temp 98.2 F (36.8 C) (Oral)   Resp 12   Ht 5\' 6"  (1.676 m)   Wt 209 lb (94.8 kg)   SpO2 99%  BMI 33.73 kg/m  General:   Alert,  pleasant and cooperative in NAD Head:  Normocephalic and atraumatic. Neck:  Supple; Lungs:  Clear throughout to auscultation.    Heart:  Regular rate and rhythm. Abdomen:  Soft, nontender and nondistended. Normal bowel sounds, without guarding, and without rebound.   Neurologic:  Alert and  oriented x4;  grossly normal neurologically.  Impression/Plan:     SCREENING  Plan:  1. TCS TODAY. DISCUSSED PROCEDURE, BENEFITS, & RISKS: < 1% chance of medication reaction, bleeding, perforation, or rupture of spleen/liver.

## 2016-10-16 NOTE — Discharge Instructions (Signed)
You DID NOT HAVE ANY POLYPS. YOU HAVE DIVERTICULOSIS IN YOUR LEFT COLON, WHICH CAUSES A SHARP LEFT TURN IN YOUR COLON. You have internal hemorrhoids.    CONTINUE YOUR WEIGHT LOSS EFFORTS.  WHILE I DO NOT WANT TO ALARM YOU, YOUR BODY MASS INDEX IS OVER 30 WHICH MEANS YOU ARE OBESE. OBESITY TURNS ON CANCER GENES. OBESITY IS ASSOCIATED WITH AN INCREASE RISK FOR ALL CANCERS, INCLUDING ESOPHAGEAL AND COLON CANCER.  DRINK WATER TO KEEP URINE LIGHT YELLOW.  FOLLOW A HIGH FIBER DIET. AVOID ITEMS THAT CAUSE BLOATING & GAS. SEE INFO BELOW.  Next colonoscopy in 10 years.   Colonoscopy Care After Read the instructions outlined below and refer to this sheet in the next week. These discharge instructions provide you with general information on caring for yourself after you leave the hospital. While your treatment has been planned according to the most current medical practices available, unavoidable complications occasionally occur. If you have any problems or questions after discharge, call DR. Naheim Burgen, [phone].  ACTIVITY  You may resume your regular activity, but move at a slower pace for the next 24 hours.   Take frequent rest periods for the next 24 hours.   Walking will help get rid of the air and reduce the bloated feeling in your belly (abdomen).   No driving for 24 hours (because of the medicine (anesthesia) used during the test).   You may shower.   Do not sign any important legal documents or operate any machinery for 24 hours (because of the anesthesia used during the test).    NUTRITION  Drink plenty of fluids.   You may resume your normal diet as instructed by your doctor.   Begin with a light meal and progress to your normal diet. Heavy or fried foods are harder to digest and may make you feel sick to your stomach (nauseated).   Avoid alcoholic beverages for 24 hours or as instructed.    MEDICATIONS  You may resume your normal medications.   WHAT YOU CAN EXPECT  TODAY  Some feelings of bloating in the abdomen.   Passage of more gas than usual.   Spotting of blood in your stool or on the toilet paper  .  IF YOU HAD POLYPS REMOVED DURING THE COLONOSCOPY:  Eat a soft diet IF YOU HAVE NAUSEA, BLOATING, ABDOMINAL PAIN, OR VOMITING.    FINDING OUT THE RESULTS OF YOUR TEST Not all test results are available during your visit. DR. Darrick Penna WILL CALL YOU WITHIN 7 DAYS OF YOUR PROCEDUE WITH YOUR RESULTS. Do not assume everything is normal if you have not heard from DR. Akim Watkinson IN ONE WEEK, CALL HER OFFICE AT [phone].  SEEK IMMEDIATE MEDICAL ATTENTION AND CALL THE OFFICE: [phone] IF:  You have more than a spotting of blood in your stool.   Your belly is swollen (abdominal distention).   You are nauseated or vomiting.   You have a temperature over 101F.   You have abdominal pain or discomfort that is severe or gets worse throughout the day.   High-Fiber Diet A high-fiber diet changes your normal diet to include more whole grains, legumes, fruits, and vegetables. Changes in the diet involve replacing refined carbohydrates with unrefined foods. The calorie level of the diet is essentially unchanged. The Dietary Reference Intake (recommended amount) for adult males is 38 grams per day. For adult females, it is 25 grams per day. Pregnant and lactating women should consume 28 grams of fiber per day. Fiber is the  intact part of a plant that is not broken down during digestion. Functional fiber is fiber that has been isolated from the plant to provide a beneficial effect in the body. PURPOSE  Increase stool bulk.   Ease and regulate bowel movements.   Lower cholesterol.  REDUCE RISK OF COLON CANCER   INDICATIONS THAT YOU NEED MORE FIBER  Constipation and hemorrhoids.   Uncomplicated diverticulosis (intestine condition) and irritable bowel syndrome.   Weight management.   As a protective measure against hardening of the arteries  (atherosclerosis), diabetes, and cancer.   GUIDELINES FOR INCREASING FIBER IN THE DIET  Start adding fiber to the diet slowly. A gradual increase of about 5 more grams (2 slices of whole-wheat bread, 2 servings of most fruits or vegetables, or 1 bowl of high-fiber cereal) per day is best. Too rapid an increase in fiber may result in constipation, flatulence, and bloating.   Drink enough water and fluids to keep your urine clear or pale yellow. Water, juice, or caffeine-free drinks are recommended. Not drinking enough fluid may cause constipation.   Eat a variety of high-fiber foods rather than one type of fiber.   Try to increase your intake of fiber through using high-fiber foods rather than fiber pills or supplements that contain small amounts of fiber.   The goal is to change the types of food eaten. Do not supplement your present diet with high-fiber foods, but replace foods in your present diet.   INCLUDE A VARIETY OF FIBER SOURCES  Replace refined and processed grains with whole grains, canned fruits with fresh fruits, and incorporate other fiber sources. White rice, white breads, and most bakery goods contain little or no fiber.   Brown whole-grain rice, buckwheat oats, and many fruits and vegetables are all good sources of fiber. These include: broccoli, Brussels sprouts, cabbage, cauliflower, beets, sweet potatoes, white potatoes (skin on), carrots, tomatoes, eggplant, squash, berries, fresh fruits, and dried fruits.   Cereals appear to be the richest source of fiber. Cereal fiber is found in whole grains and bran. Bran is the fiber-rich outer coat of cereal grain, which is largely removed in refining. In whole-grain cereals, the bran remains. In breakfast cereals, the largest amount of fiber is found in those with "bran" in their names. The fiber content is sometimes indicated on the label.   You may need to include additional fruits and vegetables each day.   In baking, for 1 cup  white flour, you may use the following substitutions:   1 cup whole-wheat flour minus 2 tablespoons.   1/2 cup white flour plus 1/2 cup whole-wheat flour.   Diverticulosis Diverticulosis is a common condition that develops when small pouches (diverticula) form in the wall of the colon. The risk of diverticulosis increases with age. It happens more often in people who eat a low-fiber diet. Most individuals with diverticulosis have no symptoms. Those individuals with symptoms usually experience belly (abdominal) pain, constipation, or loose stools (diarrhea).  HOME CARE INSTRUCTIONS  Increase the amount of fiber in your diet as directed by your caregiver or dietician. This may reduce symptoms of diverticulosis.   Drink at least 6 to 8 glasses of water each day to prevent constipation.   Try not to strain when you have a bowel movement.   THERE IS NO NEED TO Avoid nuts and seeds to prevent complications.   FOODS HAVING HIGH FIBER CONTENT INCLUDE:  Fruits. Apple, peach, pear, tangerine, raisins, prunes.   Vegetables. Brussels sprouts, asparagus, broccoli,  cabbage, carrot, cauliflower, romaine lettuce, spinach, summer squash, tomato, winter squash, zucchini.   Starchy Vegetables. Baked beans, kidney beans, lima beans, split peas, lentils, potatoes (with skin).   Grains. Whole wheat bread, brown rice, bran flake cereal, plain oatmeal, white rice, shredded wheat, bran muffins.    Hemorrhoids Hemorrhoids are dilated (enlarged) veins around the rectum. Sometimes clots will form in the veins. This makes them swollen and painful. These are called thrombosed hemorrhoids. Causes of hemorrhoids include:  Constipation.   Straining to have a bowel movement.   HEAVY LIFTING  HOME CARE INSTRUCTIONS  Eat a well balanced diet and drink 6 to 8 glasses of water every day to avoid constipation. You may also use a bulk laxative.   Avoid straining to have bowel movements.   Keep anal area dry  and clean.   Do not use a donut shaped pillow or sit on the toilet for long periods. This increases blood pooling and pain.   Move your bowels when your body has the urge; this will require less straining and will decrease pain and pressure.

## 2016-10-16 NOTE — Op Note (Signed)
[facility] Patient Name: Deanna Larsen Procedure Date: 10/16/2016 9:19 AM MRN: [id number] Date of Birth: 10-30-1950 Attending MD: Jonette Eva , MD CSN: [id number] Age: 66 Admit Type: Outpatient Procedure:                Colonoscopy, SCREENING Indications:              Screening for colorectal malignant neoplasm Providers:                Jonette Eva, MD, Edrick Kins, RN, Toniann Fail RN, RN Referring MD:             Simone Curia, MD Medicines:                Ondansetron 4 mg IV, Meperidine 75 mg IV, Midazolam                            5 mg IV Complications:            No immediate complications. Estimated Blood Loss:     Estimated blood loss: none. Procedure:                Pre-Anesthesia Assessment:                           - Prior to the procedure, a History and Physical                            was performed, and patient medications and                            allergies were reviewed. The patient's tolerance of                            previous anesthesia was also reviewed. The risks                            and benefits of the procedure and the sedation                            options and risks were discussed with the patient.                            All questions were answered, and informed consent                            was obtained. Prior Anticoagulants: The patient has                            taken previous NSAID medication, last dose was 1                            day prior to procedure. ASA Grade Assessment: II -  A patient with mild systemic disease. After                            reviewing the risks and benefits, the patient was                            deemed in satisfactory condition to undergo the                            procedure. After obtaining informed consent, the                            colonoscope was passed under direct vision.   Throughout the procedure, the patient's blood                            pressure, pulse, and oxygen saturations were                            monitored continuously. The EC-[id number] ([id number])                            scope was introduced through the anus and advanced                            to the the cecum, identified by appendiceal orifice                            and ileocecal valve. The colonoscopy was                            technically difficult and complex due to restricted                            mobility of the colon. Successful completion of the                            procedure was aided by increasing the dose of                            sedation medication, changing the patient to a                            supine position, withdrawing the scope and                            replacing with the ULTRASLIM colonoscope and                            COLOWRAP. The patient tolerated the procedure                            fairly well. The quality of the bowel preparation  was excellent. The ileocecal valve, appendiceal                            orifice, and rectum were photographed. Scope In: 10:00:08 AM Scope Out: 10:25:10 AM Scope Withdrawal Time: 0 hours 9 minutes 58 seconds  Total Procedure Duration: 0 hours 25 minutes 2 seconds  Findings:      The recto-sigmoid colon was grossly redundant.      Multiple small and large-mouthed diverticula were found in the       recto-sigmoid colon and sigmoid colon.      Internal hemorrhoids were found during retroflexion. The hemorrhoids       were small. Impression:               - ANGULATED RECTOSIGMOID JUNCTION                           - Diverticulosis in the recto-sigmoid colon and in                            the sigmoid colon.                           - Internal hemorrhoids. Moderate Sedation:      Moderate (conscious) sedation was administered by the endoscopy nurse       and  supervised by the endoscopist. The following parameters were       monitored: oxygen saturation, heart rate, blood pressure, and response       to care. Total physician intraservice time was 37 minutes. Recommendation:           - High fiber diet.                           - Continue present medications.                           - Repeat colonoscopy in 10 years for surveillance                            WITH ULTRASLIM COLONOSCOPE/COLOWRAP..                           - Patient has a contact number available for                            emergencies. The signs and symptoms of potential                            delayed complications were discussed with the                            patient. Return to normal activities tomorrow.                            Written discharge instructions were provided to the  patient. Procedure Code(s):        --- Professional ---                           [phone], Colonoscopy, flexible; diagnostic, including                            collection of specimen(s) by brushing or washing,                            when performed (separate procedure)                           99152, Moderate sedation services provided by the                            same physician or other qualified health care                            professional performing the diagnostic or                            therapeutic service that the sedation supports,                            requiring the presence of an independent trained                            observer to assist in the monitoring of the                            patient's level of consciousness and physiological                            status; initial 15 minutes of intraservice time,                            patient age 9 years or older                           [id number], Moderate sedation services; each additional                            15 minutes intraservice time Diagnosis  Code(s):        --- Professional ---                           Z12.11, Encounter for screening for malignant                            neoplasm of colon                           K64.8, Other hemorrhoids  K57.30, Diverticulosis of large intestine without                            perforation or abscess without bleeding                           Q43.8, Other specified congenital malformations of                            intestine CPT copyright 2016 [employer]. All rights reserved. The codes documented in this report are preliminary and upon coder review may  be revised to meet current compliance requirements. Jonette Eva, MD Jonette Eva, MD 10/16/2016 10:33:25 AM This report has been signed electronically. Number of Addenda: 0

## 2016-10-20 ENCOUNTER — Encounter (HOSPITAL_COMMUNITY): Payer: Self-pay | Admitting: Gastroenterology

## 2017-01-30 ENCOUNTER — Ambulatory Visit (INDEPENDENT_AMBULATORY_CARE_PROVIDER_SITE_OTHER): Payer: Medicare Other | Admitting: Family Medicine

## 2017-01-30 ENCOUNTER — Encounter: Payer: Self-pay | Admitting: Family Medicine

## 2017-01-30 VITALS — BP 180/100 | Ht 65.0 in | Wt 209.0 lb

## 2017-01-30 DIAGNOSIS — M25562 Pain in left knee: Secondary | ICD-10-CM | POA: Diagnosis not present

## 2017-01-30 DIAGNOSIS — I10 Essential (primary) hypertension: Secondary | ICD-10-CM

## 2017-01-30 NOTE — Progress Notes (Signed)
   Subjective:    Patient ID: Deanna BaarsDeborah H Zenk, female    DOB: [birth year], 66 y.o.   MRN: [id number]  Knee Pain   The incident occurred 12 to 24 hours ago. The pain is present in the left knee. The pain is at a severity of 8/10. The pain is moderate. The pain has been worsening since onset. The symptoms are aggravated by movement. She has tried ice and acetaminophen for the symptoms. The treatment provided no relief.   Pt came down steps  Gave way while walking down steps  Unsteady Hurts pretty bad Blood pressure medicine and blood pressure levels reviewed today with patient. Compliant with blood pressure medicine. States does not miss a dose. No obvious side effects. Blood pressure generally good when checked elsewhere. Watching salt intake.  Recalls no sudden injury left knee. Substantially painful. Has an orthopedist who is an injection in the past and it did help.  No other compliant.  Review of Systems No headache, no major weight loss or weight gain, no chest pain no back pain abdominal pain no change in bowel habits complete ROS otherwise negative     Objective:   Physical Exam Alert vitals stable, NAD. Blood pressure good on repeat. HEENT normal. Lungs clear. Heart regular rate and rhythm. Left knee slight effusion. Positive crepitations distinctly tender with extension and flexion.  Patient was draped prepped and anesthetized and injected 1 mL Depo-Medrol 2 mL Xylocaine       Assessment & Plan:  Impression 1 hypertension good control discussed maintain same meds #2 flare of left knee pain. Fairly severe not responding to over-the-counter agents given steroid injection hopefully this will help. Has orthopedist visit scheduled soon

## 2017-02-01 MED ORDER — METHYLPREDNISOLONE ACETATE 40 MG/ML IJ SUSP: 40.0000 mg | Freq: Once | INTRAMUSCULAR | Status: AC

## 2017-02-05 DIAGNOSIS — M25562 Pain in left knee: Secondary | ICD-10-CM | POA: Diagnosis not present

## 2017-02-05 DIAGNOSIS — M1712 Unilateral primary osteoarthritis, left knee: Secondary | ICD-10-CM | POA: Diagnosis not present

## 2017-02-16 ENCOUNTER — Other Ambulatory Visit: Payer: Self-pay | Admitting: Family Medicine

## 2017-02-16 DIAGNOSIS — Z1231 Encounter for screening mammogram for malignant neoplasm of breast: Secondary | ICD-10-CM

## 2017-02-18 ENCOUNTER — Ambulatory Visit (HOSPITAL_COMMUNITY)
Admission: RE | Admit: 2017-02-18 | Discharge: 2017-02-18 | Disposition: A | Discharge status: Home / Self Care | Payer: Medicare Other | Source: Ambulatory Visit | Attending: Family Medicine | Admitting: Family Medicine

## 2017-02-18 DIAGNOSIS — Z1231 Encounter for screening mammogram for malignant neoplasm of breast: Secondary | ICD-10-CM | POA: Diagnosis not present

## 2017-02-27 DIAGNOSIS — M1712 Unilateral primary osteoarthritis, left knee: Secondary | ICD-10-CM | POA: Diagnosis not present

## 2017-05-05 ENCOUNTER — Ambulatory Visit (INDEPENDENT_AMBULATORY_CARE_PROVIDER_SITE_OTHER): Payer: Medicare Other | Admitting: Family Medicine

## 2017-05-05 ENCOUNTER — Encounter: Payer: Self-pay | Admitting: Family Medicine

## 2017-05-05 VITALS — BP 134/88 | Temp 98.7°F | Ht 65.0 in | Wt 201.0 lb

## 2017-05-05 DIAGNOSIS — J019 Acute sinusitis, unspecified: Secondary | ICD-10-CM | POA: Diagnosis not present

## 2017-05-05 DIAGNOSIS — I10 Essential (primary) hypertension: Secondary | ICD-10-CM | POA: Diagnosis not present

## 2017-05-05 DIAGNOSIS — Z23 Encounter for immunization: Secondary | ICD-10-CM

## 2017-05-05 DIAGNOSIS — B9689 Other specified bacterial agents as the cause of diseases classified elsewhere: Secondary | ICD-10-CM | POA: Diagnosis not present

## 2017-05-05 DIAGNOSIS — E039 Hypothyroidism, unspecified: Secondary | ICD-10-CM | POA: Diagnosis not present

## 2017-05-05 DIAGNOSIS — Z79899 Other long term (current) drug therapy: Secondary | ICD-10-CM | POA: Diagnosis not present

## 2017-05-05 DIAGNOSIS — E785 Hyperlipidemia, unspecified: Secondary | ICD-10-CM

## 2017-05-05 MED ORDER — CEPHALEXIN 500 MG PO CAPS: 500.0000 mg | ORAL_CAPSULE | Freq: Three times a day (TID) | ORAL | 0 refills | Status: DC

## 2017-05-05 NOTE — Progress Notes (Signed)
   Subjective:    Patient ID: Deanna Larsen, female    DOB: 1951/06/02, 66 y.o.   MRN: [id number]  HPIleft ear ringing and right ear pain., sore throat.  Started over one week ago. Tried ibuprofen.   Some cough  Headache frontal in nature , sharp in nature, worse with cough and change of position   'sore throat     ibu prn  Dim energy     Review of Systems No headache, no major weight loss or weight gain, no chest pain no back pain abdominal pain no change in bowel habits complete ROS otherwise negative     Objective:   Physical Exam Alert, mild malaise. Hydration good Vitals stable. frontal/ maxillary tenderness evident positive nasal congestion. pharynx normal neck supple  lungs clear/no crackles or wheezes. heart regular in rhythm        Assessment & Plan:  Impression rhinosinusitis likely post viral, discussed with patient. plan antibiotics prescribed. Questions answered. Symptomatic care discussed. warning signs discussed./follow-up in one month/follow-up in one month for chronic concerns [facility]

## 2017-05-18 ENCOUNTER — Other Ambulatory Visit: Payer: Self-pay | Admitting: Family Medicine

## 2017-05-28 DIAGNOSIS — E039 Hypothyroidism, unspecified: Secondary | ICD-10-CM | POA: Diagnosis not present

## 2017-05-28 DIAGNOSIS — I10 Essential (primary) hypertension: Secondary | ICD-10-CM | POA: Diagnosis not present

## 2017-05-28 DIAGNOSIS — Z79899 Other long term (current) drug therapy: Secondary | ICD-10-CM | POA: Diagnosis not present

## 2017-05-28 DIAGNOSIS — E785 Hyperlipidemia, unspecified: Secondary | ICD-10-CM | POA: Diagnosis not present

## 2017-05-29 LAB — HEPATIC FUNCTION PANEL
ALBUMIN: 4.2 g/dL (ref 3.6–4.8)
ALK PHOS: 105 IU/L (ref 39–117)
ALT: 21 IU/L (ref 0–32)
AST: 22 IU/L (ref 0–40)
Bilirubin Total: 0.5 mg/dL (ref 0.0–1.2)
Bilirubin, Direct: 0.12 mg/dL (ref 0.00–0.40)
Total Protein: 6.9 g/dL (ref 6.0–8.5)

## 2017-05-29 LAB — BASIC METABOLIC PANEL
BUN/Creatinine Ratio: 15 (ref 12–28)
BUN: 14 mg/dL (ref 8–27)
CHLORIDE: 103 mmol/L (ref 96–106)
CO2: 24 mmol/L (ref 20–29)
Calcium: 10.3 mg/dL (ref 8.7–10.3)
Creatinine, Ser: 0.95 mg/dL (ref 0.57–1.00)
GFR calc Af Amer: 72 mL/min/{1.73_m2} (ref >59)
GFR, EST NON AFRICAN AMERICAN: 63 mL/min/{1.73_m2} (ref >59)
GLUCOSE: 100 mg/dL — AB (ref 65–99)
POTASSIUM: 5 mmol/L (ref 3.5–5.2)
Sodium: 143 mmol/L (ref 134–144)

## 2017-05-29 LAB — LIPID PANEL
CHOLESTEROL TOTAL: 156 mg/dL (ref 100–199)
Chol/HDL Ratio: 3.8 ratio (ref 0.0–4.4)
HDL: 41 mg/dL (ref >39)
LDL CALC: 85 mg/dL (ref 0–99)
TRIGLYCERIDES: 151 mg/dL — AB (ref 0–149)
VLDL CHOLESTEROL CAL: 30 mg/dL (ref 5–40)

## 2017-05-29 LAB — TSH: TSH: 0.386 u[IU]/mL — ABNORMAL LOW (ref 0.450–4.500)

## 2017-06-05 ENCOUNTER — Ambulatory Visit (INDEPENDENT_AMBULATORY_CARE_PROVIDER_SITE_OTHER): Payer: Medicare Other | Admitting: Family Medicine

## 2017-06-05 ENCOUNTER — Encounter: Payer: Self-pay | Admitting: Family Medicine

## 2017-06-05 VITALS — BP 120/74 | Ht 65.0 in | Wt 199.0 lb

## 2017-06-05 DIAGNOSIS — E039 Hypothyroidism, unspecified: Secondary | ICD-10-CM | POA: Diagnosis not present

## 2017-06-05 DIAGNOSIS — I10 Essential (primary) hypertension: Secondary | ICD-10-CM | POA: Diagnosis not present

## 2017-06-05 DIAGNOSIS — K219 Gastro-esophageal reflux disease without esophagitis: Secondary | ICD-10-CM | POA: Diagnosis not present

## 2017-06-05 DIAGNOSIS — E785 Hyperlipidemia, unspecified: Secondary | ICD-10-CM

## 2017-06-05 MED ORDER — LEVOTHYROXINE SODIUM 137 MCG PO TABS: 137.0000 ug | ORAL_TABLET | Freq: Every day | ORAL | 1 refills | Status: DC

## 2017-06-05 NOTE — Progress Notes (Signed)
Subjective:    Patient ID: Deanna Larsen, female    DOB: 11/19/1950, 66 y.o.   MRN: [id number] Patient arrives with numerous concerns Hyperlipidemia  This is a chronic problem. Treatments tried: pravastatin. There are no compliance problems (watches diet, and walks. ).    Pt states no concerns today. Had flu vaccine at last visit.   Results for orders placed or performed in visit on 05/05/17  Lipid panel  Result Value Ref Range   Cholesterol, Total 156 100 - 199 mg/dL   Triglycerides 409151 (H) 0 - 149 mg/dL   HDL 41 >81>39 mg/dL   VLDL Cholesterol Cal 30 5 - 40 mg/dL   LDL Calculated 85 0 - 99 mg/dL   Chol/HDL Ratio 3.8 0.0 - 4.4 ratio  Hepatic function panel  Result Value Ref Range   Total Protein 6.9 6.0 - 8.5 g/dL   Albumin 4.2 3.6 - 4.8 g/dL   Bilirubin Total 0.5 0.0 - 1.2 mg/dL   Bilirubin, Direct 1.910.12 0.00 - 0.40 mg/dL   Alkaline Phosphatase 105 39 - 117 IU/L   AST 22 0 - 40 IU/L   ALT 21 0 - 32 IU/L  Basic metabolic panel  Result Value Ref Range   Glucose 100 (H) 65 - 99 mg/dL   BUN 14 8 - 27 mg/dL   Creatinine, Ser 4.780.95 0.57 - 1.00 mg/dL   GFR calc non Af Amer 63 >59 mL/min/1.73   GFR calc Af Amer 72 >59 mL/min/1.73   BUN/Creatinine Ratio 15 12 - 28   Sodium 143 134 - 144 mmol/L   Potassium 5.0 3.5 - 5.2 mmol/L   Chloride 103 96 - 106 mmol/L   CO2 24 20 - 29 mmol/L   Calcium 10.3 8.7 - 10.3 mg/dL  TSH  Result Value Ref Range   TSH 0.386 (L) 0.450 - 4.500 uIU/mL   Blood pressure medicine and blood pressure levels reviewed today with patient. Compliant with blood pressure medicine. States does not miss a dose. No obvious side effects. Blood pressure generally good when checked elsewhere. Watching salt intake.  Patient continues to take lipid medication regularly. No obvious side effects from it. Generally does not miss a dose. Prior blood work results are reviewed with patient. Patient continues to work on fat intake in diet  Patient claims compliance with  thyroid medication.  Does not miss a dose.  No symptoms of high or low thyroid.  Reflux worsening.  Takes Protonix daily.  Even with that needs Tums multiple times per week. reflu  Review of Systems No headache, no major weight loss or weight gain, no chest pain no back pain abdominal pain no change in bowel habits complete ROS otherwise negative     Objective:   Physical Exam  Alert and oriented, vitals reviewed and stable, NAD ENT-TM's and ext canals WNL bilat via otoscopic exam Soft palate, tonsils and post pharynx WNL via oropharyngeal exam Neck-symmetric, no masses; thyroid nonpalpable and nontender Pulmonary-no tachypnea or accessory muscle use; Clear without wheezes via auscultation Card--no abnrml murmurs, rhythm reg and rate WNL Carotid pulses symmetric, without bruits Impression 1 hypothyroidism.  Control to type discussed meds adjusted        Assessment & Plan:  #2 hypertension.  Controlled good.  Discussed to maintain same meds  3.  Hyperlipidemia controlled good.  Prior blood work reviewed.  Maintain same discussed  4.  Reflux discussed.  Patient wishes to maintain on current meds.  Persist call and we will add  Zantac.  Rationale discussed.  Greater than 50% of this 25 minute face to face visit was spent in counseling and discussion and coordination of care regarding the above diagnosis/diagnosies

## 2017-06-07 ENCOUNTER — Other Ambulatory Visit: Payer: Self-pay | Admitting: Family Medicine

## 2017-06-08 ENCOUNTER — Other Ambulatory Visit: Payer: Self-pay | Admitting: Family Medicine

## 2017-08-15 ENCOUNTER — Other Ambulatory Visit: Payer: Self-pay | Admitting: Family Medicine

## 2017-10-21 ENCOUNTER — Telehealth: Payer: Self-pay | Admitting: Family Medicine

## 2017-10-21 NOTE — Telephone Encounter (Signed)
Lipid, CBC, TSH, hepatitis C antibody, glucose-screening, anemia, hypothyroidism, hyperlipidemia

## 2017-10-21 NOTE — Telephone Encounter (Signed)
Last labs 05/28/17 lipid, liver, bmp, tsh

## 2017-10-21 NOTE — Telephone Encounter (Signed)
Pt has follow up appt here 11/09/17 - wonders if she needs lab work due to her thyroid medicine was changed at her last visit  Please advise & call pt

## 2017-10-22 ENCOUNTER — Other Ambulatory Visit: Payer: Self-pay | Admitting: Family Medicine

## 2017-10-22 DIAGNOSIS — E785 Hyperlipidemia, unspecified: Secondary | ICD-10-CM

## 2017-10-22 DIAGNOSIS — D649 Anemia, unspecified: Secondary | ICD-10-CM

## 2017-10-22 DIAGNOSIS — Z0000 Encounter for general adult medical examination without abnormal findings: Secondary | ICD-10-CM

## 2017-10-22 DIAGNOSIS — E039 Hypothyroidism, unspecified: Secondary | ICD-10-CM

## 2017-10-22 NOTE — Telephone Encounter (Signed)
Labs ordered and pt notified

## 2017-11-03 DIAGNOSIS — D649 Anemia, unspecified: Secondary | ICD-10-CM | POA: Diagnosis not present

## 2017-11-03 DIAGNOSIS — E785 Hyperlipidemia, unspecified: Secondary | ICD-10-CM | POA: Diagnosis not present

## 2017-11-03 DIAGNOSIS — E039 Hypothyroidism, unspecified: Secondary | ICD-10-CM | POA: Diagnosis not present

## 2017-11-03 DIAGNOSIS — Z0000 Encounter for general adult medical examination without abnormal findings: Secondary | ICD-10-CM | POA: Diagnosis not present

## 2017-11-04 LAB — CBC WITH DIFFERENTIAL/PLATELET
BASOS: 0 %
Basophils Absolute: 0 10*3/uL (ref 0.0–0.2)
EOS (ABSOLUTE): 0.2 10*3/uL (ref 0.0–0.4)
EOS: 3 %
HEMATOCRIT: 48 % — AB (ref 34.0–46.6)
HEMOGLOBIN: 16 g/dL — AB (ref 11.1–15.9)
IMMATURE GRANS (ABS): 0 10*3/uL (ref 0.0–0.1)
IMMATURE GRANULOCYTES: 0 %
LYMPHS: 34 %
Lymphocytes Absolute: 2.4 10*3/uL (ref 0.7–3.1)
MCH: 30.2 pg (ref 26.6–33.0)
MCHC: 33.3 g/dL (ref 31.5–35.7)
MCV: 91 fL (ref 79–97)
MONOCYTES: 5 %
Monocytes Absolute: 0.4 10*3/uL (ref 0.1–0.9)
NEUTROS ABS: 3.9 10*3/uL (ref 1.4–7.0)
NEUTROS PCT: 58 %
PLATELETS: 269 10*3/uL (ref 150–379)
RBC: 5.3 x10E6/uL — ABNORMAL HIGH (ref 3.77–5.28)
RDW: 13.4 % (ref 12.3–15.4)
WBC: 6.8 10*3/uL (ref 3.4–10.8)

## 2017-11-04 LAB — LIPID PANEL
CHOL/HDL RATIO: 3.6 ratio (ref 0.0–4.4)
CHOLESTEROL TOTAL: 164 mg/dL (ref 100–199)
HDL: 46 mg/dL (ref >39)
LDL Calculated: 94 mg/dL (ref 0–99)
Triglycerides: 118 mg/dL (ref 0–149)
VLDL Cholesterol Cal: 24 mg/dL (ref 5–40)

## 2017-11-04 LAB — HEPATITIS C ANTIBODY

## 2017-11-04 LAB — GLUCOSE, RANDOM: Glucose: 97 mg/dL (ref 65–99)

## 2017-11-04 LAB — TSH: TSH: 1.42 u[IU]/mL (ref 0.450–4.500)

## 2017-11-09 ENCOUNTER — Ambulatory Visit (INDEPENDENT_AMBULATORY_CARE_PROVIDER_SITE_OTHER): Payer: Medicare Other | Admitting: Family Medicine

## 2017-11-09 ENCOUNTER — Encounter: Payer: Self-pay | Admitting: Family Medicine

## 2017-11-09 VITALS — BP 122/78 | Ht 65.0 in | Wt 190.8 lb

## 2017-11-09 DIAGNOSIS — I10 Essential (primary) hypertension: Secondary | ICD-10-CM

## 2017-11-09 DIAGNOSIS — E785 Hyperlipidemia, unspecified: Secondary | ICD-10-CM

## 2017-11-09 DIAGNOSIS — E039 Hypothyroidism, unspecified: Secondary | ICD-10-CM | POA: Diagnosis not present

## 2017-11-09 DIAGNOSIS — K219 Gastro-esophageal reflux disease without esophagitis: Secondary | ICD-10-CM | POA: Diagnosis not present

## 2017-11-09 MED ORDER — PRAVASTATIN SODIUM 40 MG PO TABS: ORAL_TABLET | ORAL | 1 refills | Status: DC

## 2017-11-09 MED ORDER — LEVOTHYROXINE SODIUM 137 MCG PO TABS: 137.0000 ug | ORAL_TABLET | Freq: Every day | ORAL | 1 refills | Status: DC

## 2017-11-09 MED ORDER — PANTOPRAZOLE SODIUM 40 MG PO TBEC: DELAYED_RELEASE_TABLET | ORAL | 1 refills | Status: DC

## 2017-11-09 MED ORDER — BENAZEPRIL HCL 20 MG PO TABS: ORAL_TABLET | ORAL | 1 refills | Status: DC

## 2017-11-09 NOTE — Progress Notes (Signed)
Subjective:    Patient ID: Deanna BaarsDeborah H Maxham, female    DOB: 07/23/1950, 67 y.o.   MRN: [id number]  Hypertension  This is a chronic problem. The current episode started more than 1 year ago. Risk factors for coronary artery disease include post-menopausal state. Treatments tried: lotensin. There are no compliance problems.    Discuss recent labs   Results for orders placed or performed in visit on 10/22/17  Glucose  Result Value Ref Range   Glucose 97 65 - 99 mg/dL  Hepatitis C Antibody  Result Value Ref Range   Hep C Virus Ab <0.1 0.0 - 0.9 s/co ratio  TSH  Result Value Ref Range   TSH 1.420 0.450 - 4.500 uIU/mL  CBC with Differential  Result Value Ref Range   WBC 6.8 3.4 - 10.8 x10E3/uL   RBC 5.30 (H) 3.77 - 5.28 x10E6/uL   Hemoglobin 16.0 (H) 11.1 - 15.9 g/dL   Hematocrit 86.548.0 (H) 78.434.0 - 46.6 %   MCV 91 79 - 97 fL   MCH 30.2 26.6 - 33.0 pg   MCHC 33.3 31.5 - 35.7 g/dL   RDW 69.613.4 29.512.3 - 28.415.4 %   Platelets 269 150 - 379 x10E3/uL   Neutrophils 58 Not Estab. %   Lymphs 34 Not Estab. %   Monocytes 5 Not Estab. %   Eos 3 Not Estab. %   Basos 0 Not Estab. %   Neutrophils Absolute 3.9 1.4 - 7.0 x10E3/uL   Lymphocytes Absolute 2.4 0.7 - 3.1 x10E3/uL   Monocytes Absolute 0.4 0.1 - 0.9 x10E3/uL   EOS (ABSOLUTE) 0.2 0.0 - 0.4 x10E3/uL   Basophils Absolute 0.0 0.0 - 0.2 x10E3/uL   Immature Granulocytes 0 Not Estab. %   Immature Grans (Abs) 0.0 0.0 - 0.1 x10E3/uL  Lipid Profile  Result Value Ref Range   Cholesterol, Total 164 100 - 199 mg/dL   Triglycerides 132118 0 - 149 mg/dL   HDL 46 >44>39 mg/dL   VLDL Cholesterol Cal 24 5 - 40 mg/dL   LDL Calculated 94 0 - 99 mg/dL   Chol/HDL Ratio 3.6 0.0 - 4.4 ratio   Eating pverall well these day   Patient continues to take lipid medication regularly. No obvious side effects from it. Generally does not miss a dose. Prior blood work results are reviewed with patient. Patient continues to work on fat intake in diet  Blood pressure  medicine and blood pressure levels reviewed today with patient. Compliant with blood pressure medicine. States does not miss a dose. No obvious side effects. Blood pressure generally good when checked elsewhere. Watching salt intake.  Walks daily at [employer] does five roungds   Thyroid compliant, taking meds faithfully, no symtoms of hi or low.  Generally does not miss a dose.  Reflux is stable.  Compliant with Protonix.  Is not missing post meds    Review of Systems No headache, no major weight loss or weight gain, no chest pain no back pain abdominal pain no change in bowel habits complete ROS otherwise negative     Objective:   Physical Exam  De Alert and oriented, vitals reviewed and stable, NAD ENT-TM's and ext canals WNL bilat via otoscopic exam Soft palate, tonsils and post pharynx WNL via oropharyngeal exam Neck-symmetric, no masses; thyroid nonpalpable and nontender Pulmonary-no tachypnea or accessory muscle use; Clear without wheezes via auscultation Card--no abnrml murmurs, rhythm reg and rate WNL Carotid pulses symmetric, without bruits       Assessment &  Plan:  1 hypertension.  Good control discussed.  Maintain same meds compliance discussed  2.  Hyperlipidemia.  Blood work reviewed.  Good control.  Maintain same meds Pravachol  3.  Hypothyroidism.  Clinically stable to maintain same meds  4.  Reflux clinically stable proper use of meds discussed diet exercise discussed

## 2017-11-10 ENCOUNTER — Other Ambulatory Visit: Payer: Self-pay | Admitting: Family Medicine

## 2018-02-06 ENCOUNTER — Other Ambulatory Visit: Payer: Self-pay | Admitting: Family Medicine

## 2018-02-19 ENCOUNTER — Other Ambulatory Visit: Payer: Self-pay | Admitting: Family Medicine

## 2018-02-19 DIAGNOSIS — Z1231 Encounter for screening mammogram for malignant neoplasm of breast: Secondary | ICD-10-CM

## 2018-02-26 ENCOUNTER — Ambulatory Visit (HOSPITAL_COMMUNITY)
Admission: RE | Admit: 2018-02-26 | Discharge: 2018-02-26 | Disposition: A | Discharge status: Home / Self Care | Payer: Medicare Other | Source: Ambulatory Visit | Attending: Family Medicine | Admitting: Family Medicine

## 2018-02-26 DIAGNOSIS — Z1231 Encounter for screening mammogram for malignant neoplasm of breast: Secondary | ICD-10-CM | POA: Diagnosis not present

## 2018-04-02 ENCOUNTER — Telehealth: Payer: Self-pay | Admitting: Family Medicine

## 2018-04-02 DIAGNOSIS — E785 Hyperlipidemia, unspecified: Secondary | ICD-10-CM

## 2018-04-02 DIAGNOSIS — Z79899 Other long term (current) drug therapy: Secondary | ICD-10-CM

## 2018-04-02 NOTE — Telephone Encounter (Signed)
Patient is aware labs placed.

## 2018-04-02 NOTE — Telephone Encounter (Signed)
Pt has an appt set up for Oct 16th for 51month follow up/flu shot, pt is wondering if she is needing an order for blood work to be done. Last labs that were completed are from 11/03/17. Please advise and inform patient she would like to complete ahead of time.

## 2018-04-02 NOTE — Telephone Encounter (Signed)
Patient last had b/w 11/03/2017 lipid,tsh,cbc,hepc antibody,glucose. Please advise.

## 2018-04-02 NOTE — Telephone Encounter (Signed)
Lip liv glu 

## 2018-04-28 DIAGNOSIS — E785 Hyperlipidemia, unspecified: Secondary | ICD-10-CM | POA: Diagnosis not present

## 2018-04-28 DIAGNOSIS — Z79899 Other long term (current) drug therapy: Secondary | ICD-10-CM | POA: Diagnosis not present

## 2018-04-29 LAB — HEPATIC FUNCTION PANEL
ALT: 12 IU/L (ref 0–32)
AST: 17 IU/L (ref 0–40)
Albumin: 4.3 g/dL (ref 3.6–4.8)
Alkaline Phosphatase: 92 IU/L (ref 39–117)
BILIRUBIN, DIRECT: 0.11 mg/dL (ref 0.00–0.40)
Bilirubin Total: 0.5 mg/dL (ref 0.0–1.2)
TOTAL PROTEIN: 6.8 g/dL (ref 6.0–8.5)

## 2018-04-29 LAB — LIPID PANEL
CHOL/HDL RATIO: 3.3 ratio (ref 0.0–4.4)
Cholesterol, Total: 165 mg/dL (ref 100–199)
HDL: 50 mg/dL (ref >39)
LDL CALC: 87 mg/dL (ref 0–99)
Triglycerides: 141 mg/dL (ref 0–149)
VLDL Cholesterol Cal: 28 mg/dL (ref 5–40)

## 2018-04-29 LAB — GLUCOSE, RANDOM: GLUCOSE: 96 mg/dL (ref 65–99)

## 2018-05-05 ENCOUNTER — Encounter: Payer: Self-pay | Admitting: Family Medicine

## 2018-05-05 ENCOUNTER — Ambulatory Visit (INDEPENDENT_AMBULATORY_CARE_PROVIDER_SITE_OTHER): Payer: Medicare Other | Admitting: Family Medicine

## 2018-05-05 VITALS — BP 128/82 | Ht 65.0 in | Wt 183.4 lb

## 2018-05-05 DIAGNOSIS — E039 Hypothyroidism, unspecified: Secondary | ICD-10-CM | POA: Diagnosis not present

## 2018-05-05 DIAGNOSIS — I10 Essential (primary) hypertension: Secondary | ICD-10-CM | POA: Diagnosis not present

## 2018-05-05 DIAGNOSIS — Z23 Encounter for immunization: Secondary | ICD-10-CM

## 2018-05-05 DIAGNOSIS — E785 Hyperlipidemia, unspecified: Secondary | ICD-10-CM

## 2018-05-05 DIAGNOSIS — K219 Gastro-esophageal reflux disease without esophagitis: Secondary | ICD-10-CM

## 2018-05-05 MED ORDER — LEVOTHYROXINE SODIUM 137 MCG PO TABS: 137.0000 ug | ORAL_TABLET | Freq: Every day | ORAL | 1 refills | Status: DC

## 2018-05-05 MED ORDER — BENAZEPRIL HCL 20 MG PO TABS: ORAL_TABLET | ORAL | 1 refills | Status: DC

## 2018-05-05 MED ORDER — PANTOPRAZOLE SODIUM 40 MG PO TBEC: DELAYED_RELEASE_TABLET | ORAL | 1 refills | Status: DC

## 2018-05-05 MED ORDER — PRAVASTATIN SODIUM 40 MG PO TABS: ORAL_TABLET | ORAL | 1 refills | Status: DC

## 2018-05-05 NOTE — Progress Notes (Signed)
   Subjective:    Patient ID: Deanna Larsen, female    DOB: [birth year], 67 y.o.   MRN: [id number] Patient presents with multiple concerns Hypertension  This is a chronic problem. There are no compliance problems.    Blood pressure medicine and blood pressure levels reviewed today with patient. Compliant with blood pressure medicine. States does not miss a dose. No obvious side effects. Blood pressure generally good when checked elsewhere. Watching salt intake.  Patient continues to take lipid medication regularly. No obvious side effects from it. Generally does not miss a dose. Prior blood work results are reviewed with patient. Patient continues to work on fat intake in diet  Reflux on going challenge.  Meds helps some but not perfect  thyroid patient claims compliance with supplement.  Overall energy level decent.  Does not miss a dose.  Prior blood work reviewed with patient declines  Results for orders placed or performed in visit on 04/02/18  Lipid panel  Result Value Ref Range   Cholesterol, Total 165 100 - 199 mg/dL   Triglycerides 846 0 - 149 mg/dL   HDL 50 >96 mg/dL   VLDL Cholesterol Cal 28 5 - 40 mg/dL   LDL Calculated 87 0 - 99 mg/dL   Chol/HDL Ratio 3.3 0.0 - 4.4 ratio  Hepatic function panel  Result Value Ref Range   Total Protein 6.8 6.0 - 8.5 g/dL   Albumin 4.3 3.6 - 4.8 g/dL   Bilirubin Total 0.5 0.0 - 1.2 mg/dL   Bilirubin, Direct 2.95 0.00 - 0.40 mg/dL   Alkaline Phosphatase 92 39 - 117 IU/L   AST 17 0 - 40 IU/L   ALT 12 0 - 32 IU/L  Glucose, random  Result Value Ref Range   Glucose 96 65 - 99 mg/dL     Review of Systems No headache, no major weight loss or weight gain, no chest pain no back pain abdominal pain no change in bowel habits complete ROS otherwise negative     Objective:   Physical Exam  Alert and oriented, vitals reviewed and stable, NAD ENT-TM's and ext canals WNL bilat via otoscopic exam Soft palate, tonsils and post pharynx WNL  via oropharyngeal exam Neck-symmetric, no masses; thyroid nonpalpable and nontender Pulmonary-no tachypnea or accessory muscle use; Clear without wheezes via auscultation Card--no abnrml murmurs, rhythm reg and rate WNL Carotid pulses symmetric, without bruits      Assessment & Plan:  Impression 1 hypertension good control discussed compliance discussed maintain same meds  2.  Hyperlipidemia.  Blood work excellent enzymes fine will maintain same diet discussed  3.  Reflux good though not perfect control  4.  Thyroidism clinically stable discussed  Vaccines discussed flu shot today pneumonia shot encouraged patient agrees wellness 3 months chronic visit in 6 months

## 2018-05-13 ENCOUNTER — Encounter: Payer: Self-pay | Admitting: Family Medicine

## 2018-05-13 ENCOUNTER — Ambulatory Visit (HOSPITAL_COMMUNITY)
Admission: RE | Admit: 2018-05-13 | Discharge: 2018-05-13 | Disposition: A | Discharge status: Home / Self Care | Payer: Medicare Other | Source: Ambulatory Visit | Attending: Family Medicine | Admitting: Family Medicine

## 2018-05-13 ENCOUNTER — Ambulatory Visit (INDEPENDENT_AMBULATORY_CARE_PROVIDER_SITE_OTHER): Payer: Medicare Other | Admitting: Family Medicine

## 2018-05-13 ENCOUNTER — Other Ambulatory Visit (HOSPITAL_COMMUNITY)
Admission: RE | Admit: 2018-05-13 | Discharge: 2018-05-13 | Disposition: A | Discharge status: Home / Self Care | Payer: Medicare Other | Attending: *Deleted | Admitting: *Deleted

## 2018-05-13 VITALS — BP 170/98 | Ht 60.5 in | Wt 184.0 lb

## 2018-05-13 DIAGNOSIS — R109 Unspecified abdominal pain: Secondary | ICD-10-CM | POA: Diagnosis not present

## 2018-05-13 DIAGNOSIS — R1012 Left upper quadrant pain: Secondary | ICD-10-CM

## 2018-05-13 DIAGNOSIS — I700 Atherosclerosis of aorta: Secondary | ICD-10-CM | POA: Diagnosis not present

## 2018-05-13 DIAGNOSIS — R1032 Left lower quadrant pain: Secondary | ICD-10-CM | POA: Diagnosis not present

## 2018-05-13 DIAGNOSIS — I10 Essential (primary) hypertension: Secondary | ICD-10-CM | POA: Diagnosis not present

## 2018-05-13 LAB — BASIC METABOLIC PANEL
Anion gap: 10 (ref 5–15)
BUN: 13 mg/dL (ref 8–23)
CALCIUM: 9.6 mg/dL (ref 8.9–10.3)
CO2: 26 mmol/L (ref 22–32)
CREATININE: 1.02 mg/dL — AB (ref 0.44–1.00)
Chloride: 108 mmol/L (ref 98–111)
GFR calc Af Amer: >60 mL/min (ref >60)
GFR calc non Af Amer: 56 mL/min — ABNORMAL LOW (ref >60)
Glucose, Bld: 104 mg/dL — ABNORMAL HIGH (ref 70–99)
Potassium: 4 mmol/L (ref 3.5–5.1)
SODIUM: 144 mmol/L (ref 135–145)

## 2018-05-13 LAB — CBC WITH DIFFERENTIAL/PLATELET
ABS IMMATURE GRANULOCYTES: 0.02 10*3/uL (ref 0.00–0.07)
Basophils Absolute: 0.1 10*3/uL (ref 0.0–0.1)
Basophils Relative: 1 %
EOS PCT: 2 %
Eosinophils Absolute: 0.2 10*3/uL (ref 0.0–0.5)
HCT: 48.8 % — ABNORMAL HIGH (ref 36.0–46.0)
Hemoglobin: 15.9 g/dL — ABNORMAL HIGH (ref 12.0–15.0)
Immature Granulocytes: 0 %
Lymphocytes Relative: 44 %
Lymphs Abs: 4.1 10*3/uL — ABNORMAL HIGH (ref 0.7–4.0)
MCH: 29.7 pg (ref 26.0–34.0)
MCHC: 32.6 g/dL (ref 30.0–36.0)
MCV: 91 fL (ref 80.0–100.0)
MONO ABS: 0.7 10*3/uL (ref 0.1–1.0)
MONOS PCT: 7 %
Neutro Abs: 4.2 10*3/uL (ref 1.7–7.7)
Neutrophils Relative %: 46 %
PLATELETS: 257 10*3/uL (ref 150–400)
RBC: 5.36 MIL/uL — AB (ref 3.87–5.11)
RDW: 12.8 % (ref 11.5–15.5)
WBC: 9.3 10*3/uL (ref 4.0–10.5)
nRBC: 0 % (ref 0.0–0.2)

## 2018-05-13 LAB — HEPATIC FUNCTION PANEL
ALT: 15 U/L (ref 0–44)
AST: 22 U/L (ref 15–41)
Albumin: 4.3 g/dL (ref 3.5–5.0)
Alkaline Phosphatase: 89 U/L (ref 38–126)
BILIRUBIN INDIRECT: 0.5 mg/dL (ref 0.3–0.9)
Bilirubin, Direct: 0.1 mg/dL (ref 0.0–0.2)
TOTAL PROTEIN: 7.7 g/dL (ref 6.5–8.1)
Total Bilirubin: 0.6 mg/dL (ref 0.3–1.2)

## 2018-05-13 LAB — LIPASE, BLOOD: Lipase: 47 U/L (ref 11–51)

## 2018-05-13 LAB — AMYLASE: AMYLASE: 67 U/L (ref 28–100)

## 2018-05-13 MED ORDER — IOPAMIDOL (ISOVUE-300) INJECTION 61%
100.0000 mL | Freq: Once | INTRAVENOUS | Status: AC | PRN
  Administered 2018-05-13: 100 mL via INTRAVENOUS

## 2018-05-13 NOTE — Progress Notes (Signed)
   Subjective:    Patient ID: Deanna Larsen, female    DOB: 21-Jan-1951, 67 y.o.   MRN: [id number]  HPI Patient is here today with complaints of left side upper quad pain. She states it comes off and on,and it is a shooting pain that radiates downward. She states she had the pain while she was here on 05/08/2018 visit with Dr. Brett Canales and did not mention to him as it was not as bad at the time.  Reports RUQ pain started on 05/03/18 described more as soreness that dissipated about 5-6 days ago. Now having intermittent LUQ/epigastric pain described as sharp and shooting down into LLQ x 3 days, when it occurs lasts for a minute or two. No aggravating or alleviating factors reported.  Hx of cholecystectomy Denies alcohol use Colonoscopy done in 2018 showed evidence of multiple small and large mouthed diverticula  Review of Systems  Constitutional: Negative for appetite change, chills and fever.  Respiratory: Negative for shortness of breath.   Cardiovascular: Negative for chest pain.  Gastrointestinal: Positive for abdominal pain. Negative for blood in stool, constipation, diarrhea, nausea and vomiting.       Objective:   Physical Exam  Constitutional: She is oriented to person, place, and time. She appears well-developed and well-nourished. No distress.  HENT:  Head: Normocephalic and atraumatic.  Cardiovascular: Normal rate, regular rhythm and normal heart sounds.  Pulmonary/Chest: Effort normal and breath sounds normal. No respiratory distress.  Abdominal: Soft. Normal appearance and bowel sounds are normal. She exhibits no distension and no mass. There is tenderness in the left upper quadrant and left lower quadrant. There is no rigidity.  Neurological: She is alert and oriented to person, place, and time.  Skin: Skin is warm and dry.  Psychiatric: She has a normal mood and affect.  Nursing note and vitals reviewed.     Assessment & Plan:  1. Left upper quadrant pain and left  lower quadrant pain- Plan: CBC with Differential/Platelet, Basic metabolic panel, Hepatic function panel, Amylase, Lipase, CT Abdomen Pelvis W Contrast  Will obtain STAT labs as listed above, CT scan tomorrow. Will f/u based on results. Does not appear acutely ill. Will schedule f/u in 1 week to recheck BP and abdominal pain symptoms. Warning signs discussed, pt aware of when to go to ED.    2. Elevated BP reading: likely d/t anxiety/pain. Continue taking current medication. Will reevaluate in 1 week to determine if a change needs to be made to current regimen. Warning signs discussed.   Dr. Brett Canales was consulted on this case and is in agreement with the above treatment plan.  25 minutes was spent with the patient.  This statement verifies that 25 minutes was indeed spent with the patient.  More than 50% of this visit-total duration of the visit-was spent in counseling and coordination of care. The issues that the patient came in for today as reflected in the diagnosis (s) please refer to documentation for further details.

## 2018-05-14 ENCOUNTER — Ambulatory Visit (HOSPITAL_COMMUNITY): Admission: RE | Admit: 2018-05-14 | Payer: Medicare Other | Source: Ambulatory Visit

## 2018-06-07 ENCOUNTER — Other Ambulatory Visit: Payer: Self-pay

## 2018-08-06 ENCOUNTER — Encounter: Payer: Self-pay | Admitting: Family Medicine

## 2018-08-06 ENCOUNTER — Ambulatory Visit (INDEPENDENT_AMBULATORY_CARE_PROVIDER_SITE_OTHER): Payer: Medicare Other | Admitting: Family Medicine

## 2018-08-06 VITALS — BP 154/82 | Ht 60.5 in | Wt 184.0 lb

## 2018-08-06 DIAGNOSIS — Z0000 Encounter for general adult medical examination without abnormal findings: Secondary | ICD-10-CM | POA: Diagnosis not present

## 2018-08-06 NOTE — Progress Notes (Signed)
Subjective:    Patient ID: Deanna Larsen, female    DOB: January 08, 1951, 68 y.o.   MRN: [id number]  HPI AWV- Annual Wellness Visit  The patient was seen for their annual wellness visit. The patient's past medical history, surgical history, and family history were reviewed. Pertinent vaccines were reviewed ( tetanus, pneumonia, shingles, flu) The patient's medication list was reviewed and updated.  The height and weight were entered.  BMI recorded in electronic record elsewhere  Cognitive screening was completed. Outcome of Mini - Cog: Passed   Falls /depression screening electronically recorded within record elsewhere :Yes No falls  Current tobacco usage:No (All patients who use tobacco were given written and verbal information on quitting)  Recent listing of emergency department/hospitalizations over the past year were reviewed. No  current specialist the patient sees on a regular basis: None   Medicare annual wellness visit patient questionnaire was reviewed.  A written screening schedule for the patient for the next 5-10 years was given. Appropriate discussion of followup regarding next visit was discussed.  She declines breast exam today as she says she just had a mammogram recently.  Walking everyday for exercise. Trying to eat healthy   Is not currently sexually active, denies any problems.  Does not get regular vision or dental exams.   Review of Systems  Constitutional: Negative for chills, fatigue, fever and unexpected weight change.  HENT: Negative for congestion, ear pain, sinus pressure, sinus pain and sore throat.   Eyes: Negative for discharge and visual disturbance.  Respiratory: Negative for cough, shortness of breath and wheezing.   Cardiovascular: Negative for chest pain and leg swelling.  Gastrointestinal: Negative for abdominal pain, blood in stool, constipation, diarrhea, nausea and vomiting.  Genitourinary: Negative for difficulty urinating,  hematuria, vaginal bleeding, vaginal discharge and vaginal pain.  Neurological: Negative for dizziness, weakness, light-headedness and headaches.  Psychiatric/Behavioral: Negative for dysphoric mood and suicidal ideas.  All other systems reviewed and are negative.      Objective:   Physical Exam Vitals signs and nursing note reviewed. Exam conducted with a chaperone present.  Constitutional:      General: She is not in acute distress.    Appearance: Normal appearance. She is well-developed.  HENT:     Head: Normocephalic and atraumatic.     Right Ear: Tympanic membrane normal.     Left Ear: Tympanic membrane normal.     Nose: Nose normal.     Mouth/Throat:     Mouth: Mucous membranes are moist.     Pharynx: Oropharynx is clear. Uvula midline.  Eyes:     General:        Right eye: No discharge.        Left eye: No discharge.     Conjunctiva/sclera: Conjunctivae normal.     Pupils: Pupils are equal, round, and reactive to light.  Neck:     Musculoskeletal: Neck supple.     Thyroid: No thyromegaly.     Vascular: No carotid bruit.  Cardiovascular:     Rate and Rhythm: Normal rate and regular rhythm.     Heart sounds: Normal heart sounds. No murmur.  Pulmonary:     Effort: Pulmonary effort is normal. No respiratory distress.     Breath sounds: Normal breath sounds. No wheezing.  Chest:     Comments: Declines breast exam Abdominal:     General: Bowel sounds are normal. There is no distension.     Palpations: Abdomen is soft. There is no mass.  Tenderness: There is no abdominal tenderness.  Genitourinary:    General: Normal vulva.     Labia:        Right: No rash, tenderness or lesion.        Left: No rash, tenderness or lesion.      Vagina: Normal.     Comments: Complete hysterectomy. No tenderness or obvious masses on bimanual exam.  Musculoskeletal:        General: No deformity.  Lymphadenopathy:     Cervical: No cervical adenopathy.  Skin:    General: Skin is  warm and dry.  Neurological:     Mental Status: She is alert and oriented to person, place, and time.     Coordination: Coordination normal.  Psychiatric:        Mood and Affect: Mood normal.           Assessment & Plan:  Annual physical exam Adult wellness-complete.wellness physical was conducted today. Importance of diet and exercise were discussed in detail.  In addition to this a discussion regarding safety was also covered. We also reviewed over immunizations and gave recommendations regarding current immunization needed for age.   -Declines offer of Tdap  -Declines shingrix for now; information given to patient  In addition to this additional areas were also touched on including: Preventative health exams needed:  Colonoscopy UTD Mammogram UTD  Patient was advised yearly wellness exam.  Discussed screening for osteoporosis with DEXA scan, pt declines.  Regular f/u in 3 months for chronic issues.

## 2018-08-06 NOTE — Patient Instructions (Signed)

## 2018-10-28 ENCOUNTER — Other Ambulatory Visit: Payer: Self-pay | Admitting: Family Medicine

## 2018-11-10 ENCOUNTER — Other Ambulatory Visit: Payer: Self-pay | Admitting: Family Medicine

## 2019-01-19 ENCOUNTER — Other Ambulatory Visit (HOSPITAL_COMMUNITY): Payer: Self-pay | Admitting: Family Medicine

## 2019-01-19 DIAGNOSIS — Z1231 Encounter for screening mammogram for malignant neoplasm of breast: Secondary | ICD-10-CM

## 2019-01-26 ENCOUNTER — Other Ambulatory Visit: Payer: Self-pay | Admitting: Family Medicine

## 2019-03-02 ENCOUNTER — Ambulatory Visit (HOSPITAL_COMMUNITY)
Admission: RE | Admit: 2019-03-02 | Discharge: 2019-03-02 | Disposition: A | Discharge status: Home / Self Care | Payer: Medicare Other | Source: Ambulatory Visit | Attending: Family Medicine | Admitting: Family Medicine

## 2019-03-02 ENCOUNTER — Other Ambulatory Visit: Payer: Self-pay

## 2019-03-02 DIAGNOSIS — Z1231 Encounter for screening mammogram for malignant neoplasm of breast: Secondary | ICD-10-CM | POA: Diagnosis not present

## 2019-03-10 ENCOUNTER — Emergency Department (HOSPITAL_COMMUNITY)
Admission: EM | Admit: 2019-03-10 | Discharge: 2019-03-11 | Disposition: A | Discharge status: Home / Self Care | Payer: Medicare Other | Attending: Emergency Medicine | Admitting: Emergency Medicine

## 2019-03-10 ENCOUNTER — Other Ambulatory Visit: Payer: Self-pay

## 2019-03-10 ENCOUNTER — Encounter (HOSPITAL_COMMUNITY): Payer: Self-pay | Admitting: Emergency Medicine

## 2019-03-10 DIAGNOSIS — R195 Other fecal abnormalities: Secondary | ICD-10-CM | POA: Diagnosis not present

## 2019-03-10 DIAGNOSIS — E039 Hypothyroidism, unspecified: Secondary | ICD-10-CM | POA: Diagnosis not present

## 2019-03-10 DIAGNOSIS — K921 Melena: Secondary | ICD-10-CM

## 2019-03-10 DIAGNOSIS — Z79899 Other long term (current) drug therapy: Secondary | ICD-10-CM | POA: Insufficient documentation

## 2019-03-10 DIAGNOSIS — Z87891 Personal history of nicotine dependence: Secondary | ICD-10-CM | POA: Diagnosis not present

## 2019-03-10 DIAGNOSIS — K625 Hemorrhage of anus and rectum: Secondary | ICD-10-CM | POA: Diagnosis present

## 2019-03-10 DIAGNOSIS — I10 Essential (primary) hypertension: Secondary | ICD-10-CM | POA: Insufficient documentation

## 2019-03-10 LAB — CBC
HCT: 47.6 % — ABNORMAL HIGH (ref 36.0–46.0)
Hemoglobin: 15.8 g/dL — ABNORMAL HIGH (ref 12.0–15.0)
MCH: 30.6 pg (ref 26.0–34.0)
MCHC: 33.2 g/dL (ref 30.0–36.0)
MCV: 92.2 fL (ref 80.0–100.0)
Platelets: 253 K/uL (ref 150–400)
RBC: 5.16 MIL/uL — ABNORMAL HIGH (ref 3.87–5.11)
RDW: 12.6 % (ref 11.5–15.5)
WBC: 8.8 K/uL (ref 4.0–10.5)
nRBC: 0 % (ref 0.0–0.2)

## 2019-03-10 LAB — TYPE AND SCREEN
ABO/RH(D): O NEG
Antibody Screen: NEGATIVE

## 2019-03-10 LAB — COMPREHENSIVE METABOLIC PANEL
ALT: 17 U/L (ref 0–44)
AST: 21 U/L (ref 15–41)
Albumin: 4 g/dL (ref 3.5–5.0)
Alkaline Phosphatase: 79 U/L (ref 38–126)
Anion gap: 8 (ref 5–15)
BUN: 17 mg/dL (ref 8–23)
CO2: 23 mmol/L (ref 22–32)
Calcium: 9.4 mg/dL (ref 8.9–10.3)
Chloride: 108 mmol/L (ref 98–111)
Creatinine, Ser: 1 mg/dL (ref 0.44–1.00)
GFR calc Af Amer: >60 mL/min (ref >60)
GFR calc non Af Amer: 58 mL/min — ABNORMAL LOW (ref >60)
Glucose, Bld: 107 mg/dL — ABNORMAL HIGH (ref 70–99)
Potassium: 3.7 mmol/L (ref 3.5–5.1)
Sodium: 139 mmol/L (ref 135–145)
Total Bilirubin: 0.3 mg/dL (ref 0.3–1.2)
Total Protein: 7.3 g/dL (ref 6.5–8.1)

## 2019-03-10 LAB — POC OCCULT BLOOD, ED: Fecal Occult Bld: NEGATIVE

## 2019-03-10 MED ORDER — ALUM & MAG HYDROXIDE-SIMETH 200-200-20 MG/5ML PO SUSP
30.0000 mL | Freq: Once | ORAL | Status: AC
  Administered 2019-03-10: 30 mL via ORAL
  Filled 2019-03-10: qty 30

## 2019-03-10 NOTE — ED Triage Notes (Signed)
Patient reports dark stools that started today.

## 2019-03-11 ENCOUNTER — Telehealth: Payer: Self-pay | Admitting: Gastroenterology

## 2019-03-11 NOTE — Discharge Instructions (Addendum)
Your blood work today is good, you are not anemic.  Your stool test did not show any evidence of blood tonight.  Please call Dr. Oneida Alar office in the morning to have them recheck you soon.  Return to the emergency department if you feel lightheaded, dizzy, or get worsening abdominal pain.  Also return if you start passing bright red blood.  Please be aware that certain things can make your bowel movements look black, iron and vitamins, Pepto-Bismol, spinach,, blueberries, black licorice, blood sausage, beets.

## 2019-03-11 NOTE — Telephone Encounter (Signed)
Called patient, coming Monday 03/14/19 for ov with Roseanne Kaufman

## 2019-03-11 NOTE — Telephone Encounter (Signed)
REVIEWED. AGREE. NO ADDITIONAL RECOMMENDATIONS. 

## 2019-03-11 NOTE — Telephone Encounter (Signed)
CONTACT PT FOR URGENT OPV, Dx: GI BLEED.

## 2019-03-11 NOTE — ED Provider Notes (Signed)
[facility] EMERGENCY DEPARTMENT Provider Note   CSN: [id number] Arrival date & time: 03/10/19  1703   Time seen 11:15 PM  History   Chief Complaint Chief Complaint  Patient presents with  . Rectal Bleeding    HPI Deanna Larsen is a 68 y.o. female.     HPI patient states last month she had a few drops of blood per rectum and also earlier this month.  She states about 4 PM she had 3 bowel movements in about an hour and they were all black.  At the time she had some epigastric abdominal discomfort that she describes as an ache and she currently still has it.  She states she has had GERD and GERD symptoms for a long time and she takes Protonix.  She also has to take Tums or Pepto-Bismol for symptoms not controlled by the Protonix about every 2 weeks.  However she denies taking any Pepto-Bismol today.  She denies having prior endoscopy but has had colonoscopy done by Dr. Oneida Alar that she states was negative.  She denies feeling dizzy or lightheaded.  She denies taking iron pills.  She states she is never done this before.  PCP Mikey Kirschner, MD Fabienne Bruns Dr Oneida Alar  Past Medical History:  Diagnosis Date  . Acid reflux   . Arthritis   . Hyperlipidemia   . Hypertension   . Hypothyroid   . IBS (irritable bowel syndrome)     Patient Active Problem List   Diagnosis Date Noted  . Essential hypertension, benign 03/11/2013  . Hypothyroidism 03/11/2013  . Esophageal reflux 03/11/2013  . Hyperlipidemia LDL goal <130 03/11/2013    Past Surgical History:  Procedure Laterality Date  . ABDOMINAL HYSTERECTOMY    . BILATERAL OOPHORECTOMY    . CHOLECYSTECTOMY    . COLONOSCOPY    . COLONOSCOPY N/A 10/16/2016   Procedure: COLONOSCOPY;  Surgeon: Danie Binder, MD;  Location: [facility];  Service: Endoscopy;  Laterality: N/A;  930   . HIP SURGERY Right   . JOINT REPLACEMENT    . SHOULDER SURGERY       OB History    Gravida  2   Para  2   Term  2   Preterm      AB      Living        SAB      TAB      Ectopic      Multiple      Live Births               Home Medications    Prior to Admission medications   Medication Sig Start Date End Date Taking? Authorizing Provider  benazepril (LOTENSIN) 20 MG tablet TAKE 1 AND 1/2 TABLETS(30 MG) BY MOUTH DAILY 10/28/18   Mikey Kirschner, MD  levothyroxine (SYNTHROID) 137 MCG tablet TAKE 1 TABLET BY MOUTH EVERY DAY 01/26/19   Mikey Kirschner, MD  pantoprazole (PROTONIX) 40 MG tablet TAKE 1 TABLET BY MOUTH EVERY DAY AS NEEDED FOR ACID REFLUX 01/26/19   Mikey Kirschner, MD  pravastatin (PRAVACHOL) 40 MG tablet TAKE 1 TABLET BY MOUTH DAILY 10/28/18   Mikey Kirschner, MD    Family History Family History  Problem Relation Age of Onset  . Heart disease Father   . Diabetes Brother     Social History Social History   Tobacco Use  . Smoking status: Former Smoker    Packs/day: 1.50    Years:  20.00    Pack years: 30.00    Quit date: 09/21/2005    Years since quitting: 13.4  . Smokeless tobacco: Never Used  Substance Use Topics  . Alcohol use: No  . Drug use: No     Allergies   Augmentin [amoxicillin-pot clavulanate]   Review of Systems Review of Systems  All other systems reviewed and are negative.    Physical Exam Updated Vital Signs BP (!) 155/67   Pulse 80   Temp 99.2 F (37.3 C) (Oral)   Resp 18   Ht 5\' 4"  (1.626 m)   Wt 70.3 kg   SpO2 96%   BMI 26.61 kg/m   Vital signs normal except mild hypertension   Physical Exam Vitals signs and nursing note reviewed.  Constitutional:      General: She is not in acute distress.    Appearance: Normal appearance. She is well-developed. She is not ill-appearing or toxic-appearing.  HENT:     Head: Normocephalic and atraumatic.     Right Ear: External ear normal.     Left Ear: External ear normal.     Nose: Nose normal. No mucosal edema or rhinorrhea.     Mouth/Throat:     Mouth: Mucous membranes are dry.     Dentition: No dental  abscesses.     Pharynx: No uvula swelling.  Eyes:     Extraocular Movements: Extraocular movements intact.     Conjunctiva/sclera: Conjunctivae normal.     Pupils: Pupils are equal, round, and reactive to light.  Neck:     Musculoskeletal: Full passive range of motion without pain, normal range of motion and neck supple.  Cardiovascular:     Rate and Rhythm: Normal rate and regular rhythm.     Heart sounds: Normal heart sounds. No murmur. No friction rub. No gallop.   Pulmonary:     Effort: Pulmonary effort is normal. No respiratory distress.     Breath sounds: Normal breath sounds. No wheezing, rhonchi or rales.  Chest:     Chest wall: No tenderness or crepitus.  Abdominal:     General: Bowel sounds are normal. There is no distension.     Palpations: Abdomen is soft.     Tenderness: There is abdominal tenderness in the epigastric area. There is no guarding or rebound.  Musculoskeletal: Normal range of motion.        General: No tenderness.     Comments: Moves all extremities well.   Skin:    General: Skin is warm and dry.     Capillary Refill: Capillary refill takes less than 2 seconds.     Coloration: Skin is not pale.     Findings: No erythema or rash.  Neurological:     General: No focal deficit present.     Mental Status: She is alert and oriented to person, place, and time.     Cranial Nerves: No cranial nerve deficit.  Psychiatric:        Mood and Affect: Mood normal. Mood is not anxious.        Speech: Speech normal.        Behavior: Behavior normal.      ED Treatments / Results  Labs (all labs ordered are listed, but only abnormal results are displayed) Results for orders placed or performed during the hospital encounter of 03/10/19  Comprehensive metabolic panel  Result Value Ref Range   Sodium 139 135 - 145 mmol/L   Potassium 3.7 3.5 - 5.1 mmol/L  Chloride 108 98 - 111 mmol/L   CO2 23 22 - 32 mmol/L   Glucose, Bld 107 (H) 70 - 99 mg/dL   BUN 17 8 - 23  mg/dL   Creatinine, Ser 1.611.00 0.44 - 1.00 mg/dL   Calcium 9.4 8.9 - 09.610.3 mg/dL   Total Protein 7.3 6.5 - 8.1 g/dL   Albumin 4.0 3.5 - 5.0 g/dL   AST 21 15 - 41 U/L   ALT 17 0 - 44 U/L   Alkaline Phosphatase 79 38 - 126 U/L   Total Bilirubin 0.3 0.3 - 1.2 mg/dL   GFR calc non Af Amer 58 (L) >60 mL/min   GFR calc Af Amer >60 >60 mL/min   Anion gap 8 5 - 15  CBC  Result Value Ref Range   WBC 8.8 4.0 - 10.5 K/uL   RBC 5.16 (H) 3.87 - 5.11 MIL/uL   Hemoglobin 15.8 (H) 12.0 - 15.0 g/dL   HCT 04.547.6 (H) 40.936.0 - 81.146.0 %   MCV 92.2 80.0 - 100.0 fL   MCH 30.6 26.0 - 34.0 pg   MCHC 33.2 30.0 - 36.0 g/dL   RDW 91.412.6 78.211.5 - 95.615.5 %   Platelets 253 150 - 400 K/uL   nRBC 0.0 0.0 - 0.2 %  POC occult blood, ED  Result Value Ref Range   Fecal Occult Bld NEGATIVE NEGATIVE  Type and screen [facility]  Result Value Ref Range   ABO/RH(D) O NEG    Antibody Screen NEG    Sample Expiration      03/13/2019,2359 Performed at [facility], [address] [place], [place] [id number]    Laboratory interpretation all normal except elevated hemoglobin comparable to a hemoglobin she had in October 2019    EKG None  Radiology No results found.  Procedures Procedures (including critical care time)  Colonscopy March 2018 The recto-sigmoid colon was grossly redundant. Findings: Multiple small and large-mouthed diverticula were found in the recto-sigmoid colon and sigmoid colon. Internal hemorrhoids were found during retroflexion. The hemorrhoids were small. - ANGULATED RECTOSIGMOID JUNCTION   Medications Ordered in ED Medications  alum & mag hydroxide-simeth (MAALOX/MYLANTA) 200-200-20 MG/5ML suspension 30 mL (30 mLs Oral Given 03/10/19 2346)     Initial Impression / Assessment and Plan / ED Course  I have reviewed the triage vital signs and the nursing notes.  Pertinent labs & imaging results that were available during my care of the patient were reviewed by me and considered in my  medical decision making (see chart for details).     Patient presents with black stools today with several in an hour.  She denies any precipitating event such as taking Pepto-Bismol or taking iron pills.  Her hemoglobin is stable and her Hemoccult is negative.  At this point I think she can be discharged home and follow-up with Dr. Darrick Pennafields.  She should return if she has more episodes or if she starts feeling dizzy or lightheaded.  Final Clinical Impressions(s) / ED Diagnoses   Final diagnoses:  Black stool    ED Discharge Orders    None      Plan discharge  Devoria AlbeIva Marleni Gallardo, MD, Concha PyoFACEP    Kelina Beauchamp, MD 03/11/19 [id number]

## 2019-03-14 ENCOUNTER — Other Ambulatory Visit: Payer: Self-pay

## 2019-03-14 ENCOUNTER — Ambulatory Visit (INDEPENDENT_AMBULATORY_CARE_PROVIDER_SITE_OTHER): Payer: Medicare Other | Admitting: Gastroenterology

## 2019-03-14 ENCOUNTER — Other Ambulatory Visit: Payer: Self-pay | Admitting: *Deleted

## 2019-03-14 ENCOUNTER — Encounter: Payer: Self-pay | Admitting: Gastroenterology

## 2019-03-14 ENCOUNTER — Encounter: Payer: Self-pay | Admitting: *Deleted

## 2019-03-14 VITALS — BP 147/69 | HR 71 | Temp 97.1°F | Ht 66.0 in | Wt 184.8 lb

## 2019-03-14 DIAGNOSIS — K921 Melena: Secondary | ICD-10-CM | POA: Diagnosis not present

## 2019-03-14 DIAGNOSIS — R1013 Epigastric pain: Secondary | ICD-10-CM

## 2019-03-14 NOTE — Assessment & Plan Note (Signed)
Reported one day last week. Doubt true melena but proceeding with EGD as noting upper GI symptoms.

## 2019-03-14 NOTE — Progress Notes (Signed)
Primary Care Physician:  Mikey Kirschner, MD Primary Gastroenterologist:  Dr. Oneida Alar   Chief Complaint  Patient presents with  . GI Bleeding    no further bleeding noted    HPI:   Deanna Larsen is a 68 y.o. female presenting today after ED presentation several days ago with reported melena. Hgb was 15.8 (similar to a year ago), and she was hemoccult negative. She is here for further evaluation.   Last month and this month 2 small dots of blood. Known history of internal hemorrhoids, with last colonoscopy in 2018. Thursday evening, stool was black X 2-3 times. Was scared and called Dr. Lance Sell office but was closed. No NSAIDs. Reflux worsening recently and took pepto this week. No pepto last week that she recalls when she saw black stool. No further black stool.  Protonix daily, before breakfast. Had RLQ pain and upper abdominal pain for past few weeks. Present underlying, not worsened with eating. No N/V. Abdominal pain: "just doesn't feel right". No dysphagia.     Past Medical History:  Diagnosis Date  . Acid reflux   . Arthritis   . Hyperlipidemia   . Hypertension   . Hypothyroid   . IBS (irritable bowel syndrome)     Past Surgical History:  Procedure Laterality Date  . ABDOMINAL HYSTERECTOMY    . BILATERAL OOPHORECTOMY    . CHOLECYSTECTOMY    . COLONOSCOPY    . COLONOSCOPY N/A 10/16/2016   redundant rectosigmoid colon, multiple diverticula in rectosigmoid and sigmoid, internal hemorrhoids, repeat in 2028.   Marland Kitchen HIP SURGERY Right   . JOINT REPLACEMENT    . SHOULDER SURGERY      Current Outpatient Medications  Medication Sig Dispense Refill  . benazepril (LOTENSIN) 20 MG tablet TAKE 1 AND 1/2 TABLETS(30 MG) BY MOUTH DAILY 135 tablet 1  . bismuth subsalicylate (PEPTO BISMOL) 262 MG chewable tablet Chew 524 mg by mouth as needed.    . calcium carbonate (TUMS - DOSED IN MG ELEMENTAL CALCIUM) 500 MG chewable tablet 3 tablets as needed for indigestion or  heartburn.    . levothyroxine (SYNTHROID) 137 MCG tablet TAKE 1 TABLET BY MOUTH EVERY DAY 90 tablet 0  . pantoprazole (PROTONIX) 40 MG tablet TAKE 1 TABLET BY MOUTH EVERY DAY AS NEEDED FOR ACID REFLUX (Patient taking differently: Take 40 mg by mouth daily. TAKE 1 TABLET BY MOUTH EVERY DAY AS NEEDED FOR ACID REFLUX) 90 tablet 0  . pravastatin (PRAVACHOL) 40 MG tablet TAKE 1 TABLET BY MOUTH DAILY 90 tablet 1   Current Facility-Administered Medications  Medication Dose Route Frequency Provider Last Rate Last Dose  . methylPREDNISolone acetate (DEPO-MEDROL) injection 40 mg  40 mg Intra-articular Once Mikey Kirschner, MD        Allergies as of 03/14/2019 - Review Complete 03/14/2019  Allergen Reaction Noted  . Augmentin [amoxicillin-pot clavulanate] Nausea And Vomiting 08/15/2014    Family History  Problem Relation Age of Onset  . Heart disease Father   . Diabetes Brother     Social History   Socioeconomic History  . Marital status: Widowed    Spouse name: Not on file  . Number of children: Not on file  . Years of education: Not on file  . Highest education level: Not on file  Occupational History  . Not on file  Social Needs  . Financial resource strain: Not on file  . Food insecurity    Worry: Not on file  Inability: Not on file  . Transportation needs    Medical: Not on file    Non-medical: Not on file  Tobacco Use  . Smoking status: Former Smoker    Packs/day: 1.50    Years: 20.00    Pack years: 30.00    Quit date: 09/21/2005    Years since quitting: 13.4  . Smokeless tobacco: Never Used  Substance and Sexual Activity  . Alcohol use: No  . Drug use: No  . Sexual activity: Not Currently    Birth control/protection: Surgical, Post-menopausal  Lifestyle  . Physical activity    Days per week: Not on file    Minutes per session: Not on file  . Stress: Not on file  Relationships  . Social Musicianconnections    Talks on phone: Not on file    Gets together: Not on file     Attends religious service: Not on file    Active member of club or organization: Not on file    Attends meetings of clubs or organizations: Not on file    Relationship status: Not on file  . Intimate partner violence    Fear of current or ex partner: Not on file    Emotionally abused: Not on file    Physically abused: Not on file    Forced sexual activity: Not on file  Other Topics Concern  . Not on file  Social History Narrative  . Not on file    Review of Systems: Gen: Denies any fever, chills, fatigue, weight loss, lack of appetite.  CV: Denies chest pain, heart palpitations, peripheral edema, syncope.  Resp: Denies shortness of breath at rest or with exertion. Denies wheezing or cough.  GI:see HPI GU : Denies urinary burning, urinary frequency, urinary hesitancy MS: Denies joint pain, muscle weakness, cramps, or limitation of movement.  Derm: Denies rash, itching, dry skin Psych: Denies depression, anxiety, memory loss, and confusion Heme: Denies bruising, bleeding, and enlarged lymph nodes.  Physical Exam: BP (!) 147/69   Pulse 71   Temp (!) 97.1 F (36.2 C) (Temporal)   Ht 5\' 6"  (1.676 m)   Wt 184 lb 12.8 oz (83.8 kg)   BMI 29.83 kg/m  General:   Alert and oriented. Pleasant and cooperative. Well-nourished and well-developed.  Head:  Normocephalic and atraumatic. Eyes:  Without icterus, sclera clear and conjunctiva pink.  Ears:  Normal auditory acuity. Nose:  No deformity, discharge,  or lesions. Mouth:  No deformity or lesions, oral mucosa pink.  Lungs:  Clear to auscultation bilaterally. No wheezes, rales, or rhonchi. No distress.  Heart:  S1, S2 present without murmurs appreciated.  Abdomen:  +BS, soft, mild upper abdominal TTP and non-distended. No HSM noted. No guarding or rebound. No masses appreciated.  Rectal:  Deferred  Msk:  Symmetrical without gross deformities. Normal posture. Extremities:  Without  edema. Neurologic:  Alert and  oriented x4 Psych:   Alert and cooperative. Normal mood and affect.

## 2019-03-14 NOTE — Patient Instructions (Addendum)
For now, avoid pepto bismol and any iron products. Call if any further black stool.  Increase Protonix to 30 minutes before breakfast and 30 minutes before dinner.  We have arranged an upper endoscopy with Dr. Darrick PennaFields in the near future!  It was a pleasure to see you today. I want to create trusting relationships with patients to provide genuine, compassionate, and quality care. I value your feedback. If you receive a survey regarding your visit,  I greatly appreciate you taking time to fill this out.   Gelene MinkAnna W. Melodi Happel, PhD, ANP-BC [facility]    Food Choices for Gastroesophageal Reflux Disease, Adult When you have gastroesophageal reflux disease (GERD), the foods you eat and your eating habits are very important. Choosing the right foods can help ease the discomfort of GERD. Consider working with a diet and nutrition specialist (dietitian) to help you make healthy food choices. What general guidelines should I follow?  Eating plan  Choose healthy foods low in fat, such as fruits, vegetables, whole grains, low-fat dairy products, and lean meat, fish, and poultry.  Eat frequent, small meals instead of three large meals each day. Eat your meals slowly, in a relaxed setting. Avoid bending over or lying down until 2-3 hours after eating.  Limit high-fat foods such as fatty meats or fried foods.  Limit your intake of oils, butter, and shortening to less than 8 teaspoons each day.  Avoid the following: ? Foods that cause symptoms. These may be different for different people. Keep a food diary to keep track of foods that cause symptoms. ? Alcohol. ? Drinking large amounts of liquid with meals. ? Eating meals during the 2-3 hours before bed.  Cook foods using methods other than frying. This may include baking, grilling, or broiling. Lifestyle  Maintain a healthy weight. Ask your health care provider what weight is healthy for you. If you need to lose weight, work with your  health care provider to do so safely.  Exercise for at least 30 minutes on 5 or more days each week, or as told by your health care provider.  Avoid wearing clothes that fit tightly around your waist and chest.  Do not use any products that contain nicotine or tobacco, such as cigarettes and e-cigarettes. If you need help quitting, ask your health care provider.  Sleep with the head of your bed raised. Use a wedge under the mattress or blocks under the bed frame to raise the head of the bed. What foods are not recommended? The items listed may not be a complete list. Talk with your dietitian about what dietary choices are best for you. Grains Pastries or quick breads with added fat. [place] toast. Vegetables Deep fried vegetables. [place] fries. Any vegetables prepared with added fat. Any vegetables that cause symptoms. For some people this may include tomatoes and tomato products, chili peppers, onions and garlic, and horseradish. Fruits Any fruits prepared with added fat. Any fruits that cause symptoms. For some people this may include citrus fruits, such as oranges, grapefruit, pineapple, and lemons. Meats and other protein foods High-fat meats, such as fatty beef or pork, hot dogs, ribs, ham, sausage, salami and bacon. Fried meat or protein, including fried fish and fried chicken. Nuts and nut butters. Dairy Whole milk and chocolate milk. Sour cream. Cream. Ice cream. Cream cheese. Milk shakes. Beverages Coffee and tea, with or without caffeine. Carbonated beverages. Sodas. Energy drinks. Fruit juice made with acidic fruits (such as orange or grapefruit). Tomato juice. Alcoholic  drinks. Fats and oils Butter. Margarine. Shortening. Ghee. Sweets and desserts Chocolate and cocoa. Donuts. Seasoning and other foods Pepper. Peppermint and spearmint. Any condiments, herbs, or seasonings that cause symptoms. For some people, this may include curry, hot sauce, or vinegar-based salad  dressings. Summary  When you have gastroesophageal reflux disease (GERD), food and lifestyle choices are very important to help ease the discomfort of GERD.  Eat frequent, small meals instead of three large meals each day. Eat your meals slowly, in a relaxed setting. Avoid bending over or lying down until 2-3 hours after eating.  Limit high-fat foods such as fatty meat or fried foods. This information is not intended to replace advice given to you by your health care provider. Make sure you discuss any questions you have with your health care provider. Document Released: 07/07/2005 Document Revised: 10/28/2018 Document Reviewed: 07/08/2016 Elsevier Patient Education  2020 [employer].

## 2019-03-14 NOTE — Assessment & Plan Note (Signed)
68 year old female with new onset dyspepsia, worsening reflux despite PPI, with reports of black stool last week and presented to the ED. Hemoccult negative and Hgb stable in 15 range (similar to values previously). Although she has taken pepto intermittently in the past for GERD exacerbations, she denies taking any near the time of black stool. Doubt dealing with true melena but with new onset dyspepsia warrants further diagnostic evaluation.  Proceed with upper endoscopy in the near future with Dr. Oneida Alar. The risks, benefits, and alternatives have been discussed in detail with patient. They have stated understanding and desire to proceed.  Increase PPI to BID GERD diet provided  elevated Hgb/Hct dating back several years. History of smoking but none in over 10 years. Recommend further evaluation as outpatient.

## 2019-03-15 NOTE — Progress Notes (Signed)
cc'ed to pcp °

## 2019-03-24 ENCOUNTER — Other Ambulatory Visit: Payer: Self-pay

## 2019-03-24 ENCOUNTER — Other Ambulatory Visit (HOSPITAL_COMMUNITY)
Admission: RE | Admit: 2019-03-24 | Discharge: 2019-03-24 | Disposition: A | Discharge status: Home / Self Care | Payer: Medicare Other | Source: Ambulatory Visit | Attending: Gastroenterology | Admitting: Gastroenterology

## 2019-03-24 DIAGNOSIS — Z01812 Encounter for preprocedural laboratory examination: Secondary | ICD-10-CM | POA: Insufficient documentation

## 2019-03-24 DIAGNOSIS — Z20828 Contact with and (suspected) exposure to other viral communicable diseases: Secondary | ICD-10-CM | POA: Insufficient documentation

## 2019-03-24 LAB — SARS CORONAVIRUS 2 (TAT 6-24 HRS): SARS Coronavirus 2: NEGATIVE

## 2019-03-25 ENCOUNTER — Other Ambulatory Visit: Payer: Self-pay

## 2019-03-25 ENCOUNTER — Ambulatory Visit (HOSPITAL_COMMUNITY)
Admission: RE | Admit: 2019-03-25 | Discharge: 2019-03-25 | Disposition: A | Discharge status: Home / Self Care | Payer: Medicare Other | Attending: Gastroenterology | Admitting: Gastroenterology

## 2019-03-25 ENCOUNTER — Encounter (HOSPITAL_COMMUNITY): Payer: Self-pay | Admitting: Emergency Medicine

## 2019-03-25 ENCOUNTER — Encounter (HOSPITAL_COMMUNITY)
Admission: RE | Disposition: A | Discharge status: Home / Self Care | Payer: Self-pay | Source: Home / Self Care | Attending: Gastroenterology

## 2019-03-25 DIAGNOSIS — K921 Melena: Secondary | ICD-10-CM

## 2019-03-25 DIAGNOSIS — K317 Polyp of stomach and duodenum: Secondary | ICD-10-CM | POA: Diagnosis not present

## 2019-03-25 DIAGNOSIS — M1990 Unspecified osteoarthritis, unspecified site: Secondary | ICD-10-CM | POA: Diagnosis not present

## 2019-03-25 DIAGNOSIS — E039 Hypothyroidism, unspecified: Secondary | ICD-10-CM | POA: Diagnosis not present

## 2019-03-25 DIAGNOSIS — K2970 Gastritis, unspecified, without bleeding: Secondary | ICD-10-CM | POA: Insufficient documentation

## 2019-03-25 DIAGNOSIS — Z79890 Hormone replacement therapy (postmenopausal): Secondary | ICD-10-CM | POA: Insufficient documentation

## 2019-03-25 DIAGNOSIS — K589 Irritable bowel syndrome without diarrhea: Secondary | ICD-10-CM | POA: Insufficient documentation

## 2019-03-25 DIAGNOSIS — K219 Gastro-esophageal reflux disease without esophagitis: Secondary | ICD-10-CM | POA: Insufficient documentation

## 2019-03-25 DIAGNOSIS — Z87891 Personal history of nicotine dependence: Secondary | ICD-10-CM | POA: Diagnosis not present

## 2019-03-25 DIAGNOSIS — Z79899 Other long term (current) drug therapy: Secondary | ICD-10-CM | POA: Diagnosis not present

## 2019-03-25 DIAGNOSIS — R1013 Epigastric pain: Secondary | ICD-10-CM

## 2019-03-25 DIAGNOSIS — I10 Essential (primary) hypertension: Secondary | ICD-10-CM | POA: Insufficient documentation

## 2019-03-25 DIAGNOSIS — E785 Hyperlipidemia, unspecified: Secondary | ICD-10-CM | POA: Insufficient documentation

## 2019-03-25 HISTORY — PX: BIOPSY: SHX5522

## 2019-03-25 HISTORY — PX: ESOPHAGOGASTRODUODENOSCOPY: SHX5428

## 2019-03-25 SURGERY — EGD (ESOPHAGOGASTRODUODENOSCOPY)
Anesthesia: Moderate Sedation

## 2019-03-25 MED ORDER — MEPERIDINE HCL 100 MG/ML IJ SOLN
INTRAMUSCULAR | Status: DC | PRN
  Administered 2019-03-25: 50 mg via INTRAVENOUS
  Administered 2019-03-25: 25 mg via INTRAVENOUS

## 2019-03-25 MED ORDER — LIDOCAINE VISCOUS HCL 2 % MT SOLN
OROMUCOSAL | Status: AC
  Filled 2019-03-25: qty 15

## 2019-03-25 MED ORDER — STERILE WATER FOR IRRIGATION IR SOLN
Status: DC | PRN
  Administered 2019-03-25: 14:00:00 2.5 mL

## 2019-03-25 MED ORDER — ONDANSETRON HCL 4 MG/2ML IJ SOLN
INTRAMUSCULAR | Status: AC
  Filled 2019-03-25: qty 2

## 2019-03-25 MED ORDER — LIDOCAINE VISCOUS HCL 2 % MT SOLN
OROMUCOSAL | Status: DC | PRN
  Administered 2019-03-25: 4 mL via OROMUCOSAL

## 2019-03-25 MED ORDER — PANTOPRAZOLE SODIUM 40 MG PO TBEC: DELAYED_RELEASE_TABLET | ORAL | 11 refills | Status: DC

## 2019-03-25 MED ORDER — SODIUM CHLORIDE 0.9 % IV SOLN
INTRAVENOUS | Status: DC
  Administered 2019-03-25: 13:00:00 via INTRAVENOUS

## 2019-03-25 MED ORDER — MIDAZOLAM HCL 5 MG/5ML IJ SOLN
INTRAMUSCULAR | Status: AC
  Filled 2019-03-25: qty 10

## 2019-03-25 MED ORDER — MEPERIDINE HCL 100 MG/ML IJ SOLN
INTRAMUSCULAR | Status: AC
  Filled 2019-03-25: qty 2

## 2019-03-25 MED ORDER — ONDANSETRON HCL 4 MG/2ML IJ SOLN
INTRAMUSCULAR | Status: DC | PRN
  Administered 2019-03-25: 4 mg via INTRAVENOUS

## 2019-03-25 MED ORDER — MIDAZOLAM HCL 5 MG/5ML IJ SOLN
INTRAMUSCULAR | Status: DC | PRN
  Administered 2019-03-25 (×2): 2 mg via INTRAVENOUS

## 2019-03-25 NOTE — Op Note (Signed)
[facility] Patient Name: Deanna Larsen Procedure Date: 03/25/2019 1:35 PM MRN: [id number] Date of Birth: 1951/02/21 Attending MD: Jonette Eva MD, MD CSN: [id number] Age: 68 Admit Type: Outpatient Procedure:                Upper GI endoscopy WITH COLD FORCEPS BIOPSY Indications:              Dyspepsia Providers:                Jonette Eva MD, MD, Loma Messing B. Patsy Lager, RN,                            Burke Keels, Technician Referring MD:             Simone Curia, MD Medicines:                Ondansetron 4 mg IV, Meperidine 75 mg IV, Midazolam                            4 mg IV Complications:            No immediate complications. Estimated Blood Loss:     Estimated blood loss: none. Procedure:                Pre-Anesthesia Assessment:                           - Prior to the procedure, a History and Physical                            was performed, and patient medications and                            allergies were reviewed. The patient's tolerance of                            previous anesthesia was also reviewed. The risks                            and benefits of the procedure and the sedation                            options and risks were discussed with the patient.                            All questions were answered, and informed consent                            was obtained. Prior Anticoagulants: The patient has                            taken no previous anticoagulant or antiplatelet                            agents. ASA Grade Assessment: II - A patient with  mild systemic disease. After reviewing the risks                            and benefits, the patient was deemed in                            satisfactory condition to undergo the procedure.                            After obtaining informed consent, the endoscope was                            passed under direct vision. Throughout the                             procedure, the patient's blood pressure, pulse, and                            oxygen saturations were monitored continuously. The                            GIF-[id number] ([id number] scope was introduced through the                            mouth, and advanced to the second part of duodenum.                            The upper GI endoscopy was accomplished without                            difficulty. The patient tolerated the procedure                            well. Scope In: 2:01:59 PM Scope Out: 2:07:53 PM Total Procedure Duration: 0 hours 5 minutes 54 seconds  Findings:      The examined esophagus was normal.      Multiple small sessile polyps with no stigmata of recent bleeding were       found in the gastric body and in the gastric antrum. The polyp was       removed with a cold biopsy forceps. Resection and retrieval were       complete.      Localized mild inflammation characterized by congestion (edema) and       erythema was found in the gastric antrum. Biopsies were taken with a       cold forceps for Helicobacter pylori testing.      The examined duodenum was normal. Impression:               - DYSPEPSIA DUE TO UNCONTROLLED GERD                           - Multiple gastric polyps. Resected and retrieved.                           - MILD Gastritis. Biopsied.                           -  Moderate Sedation:      Moderate (conscious) sedation was administered by the endoscopy nurse       and supervised by the endoscopist. The following parameters were       monitored: oxygen saturation, heart rate, blood pressure, and response       to care. Total physician intraservice time was 16 minutes. Recommendation:           - Patient has a contact number available for                            emergencies. The signs and symptoms of potential                            delayed complications were discussed with the                            patient. Return to normal activities  tomorrow.                            Written discharge instructions were provided to the                            patient.                           - Low fat diet.                           - Continue present medications. PROTONIX BID. AVOID                            REFLUX TRIGGERS.                           - Await pathology results.                           - Return to my office in 6 months. Procedure Code(s):        --- Professional ---                           [phone] [id number], Esophagogastroduodenoscopy, flexible,                            transoral; with biopsy, single or multiple                           G0500, Moderate sedation services provided by the                            same physician or other qualified health care                            professional performing a gastrointestinal                            endoscopic service  that sedation supports,                            requiring the presence of an independent trained                            observer to assist in the monitoring of the                            patient's level of consciousness and physiological                            status; initial 15 minutes of intra-service time;                            patient age 34 years or older (additional time may                            be reported with [phone], as appropriate) Diagnosis Code(s):        --- Professional ---                           K31.7, Polyp of stomach and duodenum                           K29.70, Gastritis, unspecified, without bleeding                           R10.13, Epigastric pain CPT copyright 2019 [employer]. All rights reserved. The codes documented in this report are preliminary and upon coder review may  be revised to meet current compliance requirements. Barney Drain, MD Barney Drain MD, MD 03/25/2019 2:25:03 PM This report has been signed electronically. Number of Addenda: 0

## 2019-03-25 NOTE — Discharge Instructions (Signed)
You have mild gastritis and BENIGN POLYPS IN YOUR STOMACH. I biopsied your stomach.   DRINK WATER TO KEEP YOUR URINE LIGHT YELLOW.  FOLLOW A LOW FAT DIET. MEATS SHOULD BE BAKED, BROILED, OR BOILED. AVOID FRIED FOODS. Do not eat fast food.  AVOID REFLUX TRIGGERS. SEE INFO BELOW.   INCREASE PROTONIX. TAKE 30 MINUTES PRIOR TO MEALS TWICE DAILY.   YOUR BIOPSY RESULTS WILL BE BACK IN 5 BUSINESS DAYS.  FOLLOW UP IN 6 MOS.  UPPER ENDOSCOPY AFTER CARE Read the instructions outlined below and refer to this sheet in the next week. These discharge instructions provide you with general information on caring for yourself after you leave the hospital. While your treatment has been planned according to the most current medical practices available, unavoidable complications occasionally occur. If you have any problems or questions after discharge, call DR. Paarth Cropper, [phone].  ACTIVITY  You may resume your regular activity, but move at a slower pace for the next 24 hours.   Take frequent rest periods for the next 24 hours.   Walking will help get rid of the air and reduce the bloated feeling in your belly (abdomen).   No driving for 24 hours (because of the medicine (anesthesia) used during the test).   You may shower.   Do not sign any important legal documents or operate any machinery for 24 hours (because of the anesthesia used during the test).    NUTRITION  Drink plenty of fluids.   You may resume your normal diet as instructed by your doctor.   Begin with a light meal and progress to your normal diet. Heavy or fried foods are harder to digest and may make you feel sick to your stomach (nauseated).   Avoid alcoholic beverages for 24 hours or as instructed.    MEDICATIONS  You may resume your normal medications.   WHAT YOU CAN EXPECT TODAY  Some feelings of bloating in the abdomen.   Passage of more gas than usual.    IF YOU HAD A BIOPSY TAKEN DURING THE UPPER  ENDOSCOPY:  Eat a soft diet IF YOU HAVE NAUSEA, BLOATING, ABDOMINAL PAIN, OR VOMITING.    FINDING OUT THE RESULTS OF YOUR TEST Not all test results are available during your visit. DR. Darrick PennaFIELDS WILL CALL YOU WITHIN 14 DAYS OF YOUR PROCEDUE WITH YOUR RESULTS. Do not assume everything is normal if you have not heard from DR. Diarra Ceja, CALL HER OFFICE AT [phone].  SEEK IMMEDIATE MEDICAL ATTENTION AND CALL THE OFFICE: [phone] IF:  You have more than a spotting of blood in your stool.   Your belly is swollen (abdominal distention).   You are nauseated or vomiting.   You have a temperature over 101F.   You have abdominal pain or discomfort that is severe or gets worse throughout the day.   Lifestyle and home remedies TO MANAGE REFLUX/HERATBURN  You may eliminate or reduce the frequency of heartburn by making the following lifestyle changes:   Control your weight. Being overweight is a major risk factor for heartburn and GERD. Excess pounds put pressure on your abdomen, pushing up your stomach and causing acid to back up into your esophagus.    Eat smaller meals. 4 TO 6 MEALS A DAY. This reduces pressure on the lower esophageal sphincter, helping to prevent the valve from opening and acid from washing back into your esophagus.    Loosen your belt. Clothes that fit tightly around your waist put pressure on your abdomen and  the lower esophageal sphincter.    Eliminate heartburn triggers. Everyone has specific triggers. Common triggers such as fatty or fried foods, spicy food, tomato sauce, carbonated beverages, alcohol, chocolate, mint, garlic, onion, caffeine and nicotine may make heartburn worse.    Avoid stooping or bending. Tying your shoes is OK. Bending over for longer periods to weed your garden isn't, especially soon after eating.    Don't lie down after a meal. Wait at least three to four hours after eating before going to bed, and don't lie down right after eating.      PUT THE HEAD OF YOUR BED ON 6 INCH BLOCKS OR SLEEP ON A WEDGE TO KEEP YOUR HEAD ABOVE YOUR HEART WHEN YOU SLEEP.   Alternative medicine  Several home remedies exist for treating GERD, but they provide only temporary relief. They include drinking baking soda (sodium bicarbonate) added to water or drinking other fluids such as baking soda mixed with cream of tartar and water.   Although these liquids create temporary relief by neutralizing, washing away or buffering acids, eventually they aggravate the situation by adding gas and fluid to your stomach, increasing pressure and causing more acid reflux. Further, adding more sodium to your diet may increase your blood pressure and add stress to your heart, and excessive bicarbonate ingestion can alter the acid-base balance in your body.  Gastritis  Gastritis is an inflammation (the body's way of reacting to injury and/or infection) of the stomach. It is often caused by viral or bacterial (germ) infections. It can also be caused BY ASPIRIN, BC/GOODY POWDER'S, (IBUPROFEN) MOTRIN, OR ALEVE (NAPROXEN), chemicals (including alcohol), SPICY FOODS, and medications. This illness may be associated with generalized malaise (feeling tired, not well), UPPER ABDOMINAL STOMACH cramps, and fever. One common bacterial cause of gastritis is an organism known as H. Pylori. This can be treated with antibiotics.

## 2019-03-25 NOTE — H&P (Signed)
Primary Care Physician:  Mikey Kirschner, MD Primary Gastroenterologist:  Dr. Oneida Alar  Pre-Procedure History & Physical: HPI:  Deanna Larsen is a 68 y.o. female here for dyspepsia and formed black stool X1 and soft black stool X1 after Pepto Bismol.  Past Medical History:  Diagnosis Date  . Acid reflux   . Arthritis   . Hyperlipidemia   . Hypertension   . Hypothyroid   . IBS (irritable bowel syndrome)     Past Surgical History:  Procedure Laterality Date  . ABDOMINAL HYSTERECTOMY    . BILATERAL OOPHORECTOMY    . CHOLECYSTECTOMY    . COLONOSCOPY    . COLONOSCOPY N/A 10/16/2016   redundant rectosigmoid colon, multiple diverticula in rectosigmoid and sigmoid, internal hemorrhoids, repeat in 2028.   Marland Kitchen HIP SURGERY Right   . JOINT REPLACEMENT    . SHOULDER SURGERY      Prior to Admission medications   Medication Sig Start Date End Date Taking? Authorizing Provider  acetaminophen (TYLENOL) 500 MG tablet Take 500 mg by mouth every 6 (six) hours as needed for moderate pain or headache.   Yes [provider]  benazepril (LOTENSIN) 20 MG tablet TAKE 1 AND 1/2 TABLETS(30 MG) BY MOUTH DAILY Patient taking differently: Take 30 mg by mouth at bedtime.  10/28/18  Yes Mikey Kirschner, MD  calcium carbonate (TUMS - DOSED IN MG ELEMENTAL CALCIUM) 500 MG chewable tablet Chew 2 tablets by mouth 2 (two) times daily as needed for indigestion or heartburn.    Yes [provider]  levothyroxine (SYNTHROID) 137 MCG tablet TAKE 1 TABLET BY MOUTH EVERY DAY Patient taking differently: Take 137 mcg by mouth daily.  01/26/19  Yes Mikey Kirschner, MD  pantoprazole (PROTONIX) 40 MG tablet TAKE 1 TABLET BY MOUTH EVERY DAY AS NEEDED FOR ACID REFLUX Patient taking differently: Take 40 mg by mouth 2 (two) times daily.  01/26/19  Yes Mikey Kirschner, MD  pravastatin (PRAVACHOL) 40 MG tablet TAKE 1 TABLET BY MOUTH DAILY Patient taking differently: Take 40 mg by mouth at bedtime.  10/28/18   Yes Mikey Kirschner, MD    Allergies as of 03/14/2019 - Review Complete 03/14/2019  Allergen Reaction Noted  . Augmentin [amoxicillin-pot clavulanate] Nausea And Vomiting 08/15/2014    Family History  Problem Relation Age of Onset  . Heart disease Father   . Diabetes Brother   . Colon cancer Neg Hx   . Colon polyps Neg Hx     Social History   Socioeconomic History  . Marital status: Widowed    Spouse name: Not on file  . Number of children: Not on file  . Years of education: Not on file  . Highest education level: Not on file  Occupational History  . Occupation: retired  Scientific laboratory technician  . Financial resource strain: Not on file  . Food insecurity    Worry: Not on file    Inability: Not on file  . Transportation needs    Medical: Not on file    Non-medical: Not on file  Tobacco Use  . Smoking status: Former Smoker    Packs/day: 1.50    Years: 20.00    Pack years: 30.00    Quit date: 09/21/2005    Years since quitting: 13.5  . Smokeless tobacco: Never Used  Substance and Sexual Activity  . Alcohol use: No  . Drug use: No  . Sexual activity: Not Currently    Birth control/protection: Surgical, Post-menopausal  Lifestyle  .  Physical activity    Days per week: Not on file    Minutes per session: Not on file  . Stress: Not on file  Relationships  . Social Musicianconnections    Talks on phone: Not on file    Gets together: Not on file    Attends religious service: Not on file    Active member of club or organization: Not on file    Attends meetings of clubs or organizations: Not on file    Relationship status: Not on file  . Intimate partner violence    Fear of current or ex partner: Not on file    Emotionally abused: Not on file    Physically abused: Not on file    Forced sexual activity: Not on file  Other Topics Concern  . Not on file  Social History Narrative  . Not on file    Review of Systems: See HPI, otherwise negative ROS   Physical Exam: BP (!)  151/74   Pulse 70   Temp 98.2 F (36.8 C) (Oral)   Resp 15   SpO2 100%  General:   Alert,  pleasant and cooperative in NAD Head:  Normocephalic and atraumatic. Neck:  Supple; Lungs:  Clear throughout to auscultation.    Heart:  Regular rate and rhythm. Abdomen:  Soft, nontender and nondistended. Normal bowel sounds, without guarding, and without rebound.   Neurologic:  Alert and  oriented x4;  grossly normal neurologically.  Impression/Plan:     DYSPEPSIA  PLAN:  EGD TODAY.  DISCUSSED PROCEDURE, BENEFITS, & RISKS: < 1% chance of medication reaction, bleeding, perforation, or ASPIRATION.

## 2019-03-30 ENCOUNTER — Encounter (HOSPITAL_COMMUNITY): Payer: Self-pay | Admitting: Gastroenterology

## 2019-03-31 ENCOUNTER — Telehealth: Payer: Self-pay | Admitting: Gastroenterology

## 2019-03-31 NOTE — Telephone Encounter (Signed)
Please call pt. HER stomach Bx shows mild gastritis.    DRINK WATER TO KEEP YOUR URINE LIGHT YELLOW.  FOLLOW A LOW FAT DIET. MEATS SHOULD BE BAKED, BROILED, OR BOILED. AVOID FRIED FOODS. Do not eat fast food. AVOID REFLUX TRIGGERS.  CONTINUE PROTONIX. TAKE 30 MINUTES PRIOR TO MEALS TWICE DAILY.  FOLLOW UP IN 6 MOS.

## 2019-04-01 NOTE — Telephone Encounter (Signed)
Pt is aware of results and plan.

## 2019-04-10 NOTE — Telephone Encounter (Signed)
Reminder in epic °

## 2019-04-26 ENCOUNTER — Other Ambulatory Visit: Payer: Self-pay | Admitting: Family Medicine

## 2019-04-26 NOTE — Telephone Encounter (Signed)
Call n sched f u soon, then may ref times one

## 2019-04-26 NOTE — Telephone Encounter (Signed)
Please call patient and schedule appt; then may route to nurses. Thank you

## 2019-04-27 NOTE — Telephone Encounter (Signed)
Patient want office visit for follow up on 10/22

## 2019-05-12 ENCOUNTER — Other Ambulatory Visit: Payer: Self-pay

## 2019-05-12 ENCOUNTER — Ambulatory Visit (INDEPENDENT_AMBULATORY_CARE_PROVIDER_SITE_OTHER): Payer: Medicare Other | Admitting: Family Medicine

## 2019-05-12 ENCOUNTER — Encounter: Payer: Self-pay | Admitting: Family Medicine

## 2019-05-12 VITALS — BP 132/84 | Temp 98.1°F | Ht 65.0 in | Wt 185.0 lb

## 2019-05-12 DIAGNOSIS — E039 Hypothyroidism, unspecified: Secondary | ICD-10-CM | POA: Diagnosis not present

## 2019-05-12 DIAGNOSIS — K219 Gastro-esophageal reflux disease without esophagitis: Secondary | ICD-10-CM

## 2019-05-12 DIAGNOSIS — E785 Hyperlipidemia, unspecified: Secondary | ICD-10-CM | POA: Diagnosis not present

## 2019-05-12 DIAGNOSIS — I10 Essential (primary) hypertension: Secondary | ICD-10-CM

## 2019-05-12 DIAGNOSIS — Z23 Encounter for immunization: Secondary | ICD-10-CM

## 2019-05-12 MED ORDER — BENAZEPRIL HCL 20 MG PO TABS: ORAL_TABLET | ORAL | 1 refills | Status: AC

## 2019-05-12 MED ORDER — PRAVASTATIN SODIUM 40 MG PO TABS: 40.0000 mg | ORAL_TABLET | Freq: Every day | ORAL | 1 refills | Status: AC

## 2019-05-12 MED ORDER — PANTOPRAZOLE SODIUM 40 MG PO TBEC: DELAYED_RELEASE_TABLET | ORAL | 1 refills | Status: AC

## 2019-05-12 MED ORDER — LEVOTHYROXINE SODIUM 137 MCG PO TABS: 137.0000 ug | ORAL_TABLET | Freq: Every day | ORAL | 1 refills | Status: AC

## 2019-05-12 NOTE — Progress Notes (Signed)
   Subjective:    Patient ID: Deanna Larsen, female    DOB: 15-Feb-1951, 68 y.o.   MRN: [id number]  Hypertension This is a chronic problem. The current episode started more than 1 year ago. Risk factors for coronary artery disease include dyslipidemia and post-menopausal state. Treatments tried: lotensin. There are no compliance problems.    Patient continues to take lipid medication regularly. No obvious side effects from it. Generally does not miss a dose. Prior blood work results are reviewed with patient. Patient continues to work on fat intake in diet  Blood pressure medicine and blood pressure levels reviewed today with patient. Compliant with blood pressure medicine. States does not miss a dose. No obvious side effects. Blood pressure generally good when checked elsewhere. Watching salt intake.    thyroid.  Patient claims compliance with thyroid medicine.  No symptoms of high or low thyroid.  States does not miss a dose   Review of Systems  No headache, no major weight loss or weight gain, no chest pain no back pain abdominal pain no change in bowel habits complete ROS otherwise negative     Objective:   Physical Exam  Alert and oriented, vitals reviewed and stable, NAD ENT-TM's and ext canals WNL bilat via otoscopic exam Soft palate, tonsils and post pharynx WNL via oropharyngeal exam Neck-symmetric, no masses; thyroid nonpalpable and nontender Pulmonary-no tachypnea or accessory muscle use; Clear without wheezes via auscultation Card--no abnrml murmurs, rhythm reg and rate WNL Carotid pulses symmetric, without bruits       Assessment & Plan:  Impression 1 hypertension.  Good control discussed to maintain same meds  2.  Hyperlipidemia.  Status uncertain.  Check appropriate blood work diet exercise discussed compliance discussed  3.  Hypothyroidism status uncertain check appropriate blood work with the recommendations based on results  4.  COVID-19 questions  answered  Greater than 50% of this 25 minute face to face visit was spent in counseling and discussion and coordination of care regarding the above diagnosis/diagnosies Appropriate blood work.  Flu shot recommended

## 2019-05-13 ENCOUNTER — Other Ambulatory Visit: Payer: Self-pay | Admitting: *Deleted

## 2019-05-13 DIAGNOSIS — E039 Hypothyroidism, unspecified: Secondary | ICD-10-CM | POA: Diagnosis not present

## 2019-05-13 DIAGNOSIS — E785 Hyperlipidemia, unspecified: Secondary | ICD-10-CM | POA: Diagnosis not present

## 2019-05-13 DIAGNOSIS — I10 Essential (primary) hypertension: Secondary | ICD-10-CM

## 2019-05-14 LAB — BASIC METABOLIC PANEL
BUN/Creatinine Ratio: 16 (ref 12–28)
BUN: 14 mg/dL (ref 8–27)
CO2: 23 mmol/L (ref 20–29)
Calcium: 10 mg/dL (ref 8.7–10.3)
Chloride: 106 mmol/L (ref 96–106)
Creatinine, Ser: 0.88 mg/dL (ref 0.57–1.00)
GFR calc Af Amer: 78 mL/min/{1.73_m2} (ref >59)
GFR calc non Af Amer: 68 mL/min/{1.73_m2} (ref >59)
Glucose: 94 mg/dL (ref 65–99)
Potassium: 4.5 mmol/L (ref 3.5–5.2)
Sodium: 142 mmol/L (ref 134–144)

## 2019-05-14 LAB — LIPID PANEL
Chol/HDL Ratio: 3.7 ratio (ref 0.0–4.4)
Cholesterol, Total: 161 mg/dL (ref 100–199)
HDL: 44 mg/dL (ref >39)
LDL Chol Calc (NIH): 97 mg/dL (ref 0–99)
Triglycerides: 108 mg/dL (ref 0–149)
VLDL Cholesterol Cal: 20 mg/dL (ref 5–40)

## 2019-05-14 LAB — HEPATIC FUNCTION PANEL
ALT: 9 IU/L (ref 0–32)
AST: 18 IU/L (ref 0–40)
Albumin: 4.4 g/dL (ref 3.8–4.8)
Alkaline Phosphatase: 97 IU/L (ref 39–117)
Bilirubin Total: 0.6 mg/dL (ref 0.0–1.2)
Bilirubin, Direct: 0.19 mg/dL (ref 0.00–0.40)
Total Protein: 6.9 g/dL (ref 6.0–8.5)

## 2019-05-14 LAB — TSH: TSH: 1.87 u[IU]/mL (ref 0.450–4.500)

## 2019-05-15 ENCOUNTER — Encounter: Payer: Self-pay | Admitting: Family Medicine

## 2019-07-16 IMAGING — CT CT ABD-PELV W/ CM
2 of 5 series · 17 of 46 positions shown, 19 images · IV contrast (Isovue)
Comparison: 07/31/2005 CT

CLINICAL DATA: 67-year-old female with acute LEFT abdominal and
pelvic pain for 10 days.

EXAM:
CT ABDOMEN AND PELVIS WITH CONTRAST
TECHNIQUE: Multidetector CT imaging of the abdomen and pelvis was performed
using the standard protocol following bolus administration of
intravenous contrast.
CONTRAST:  100mL KYZ1F3-3CC IOPAMIDOL (KYZ1F3-3CC) INJECTION 61%

[Series 2: axial st · axial · 0.68mm/px · z∈[+914,+1334]mm · 14 of 96 slices shown, 16 images]
[im 6/96  soft-tissue]
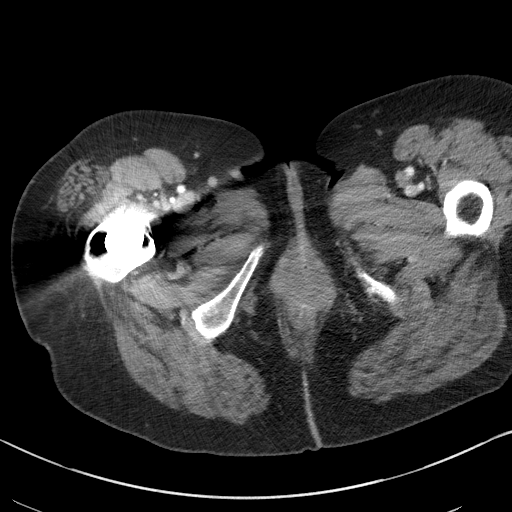
[im 6/96  bone]
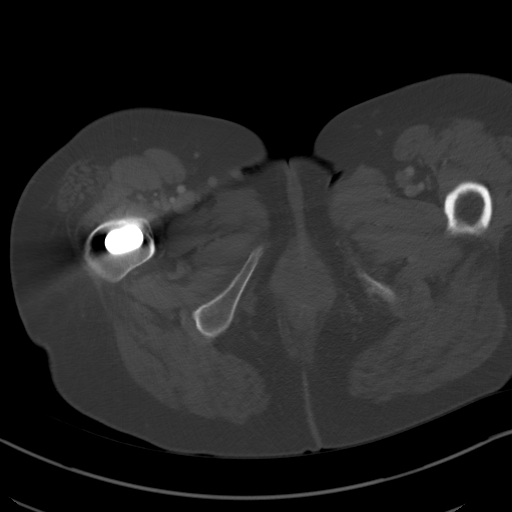
[im 12/96  soft-tissue]
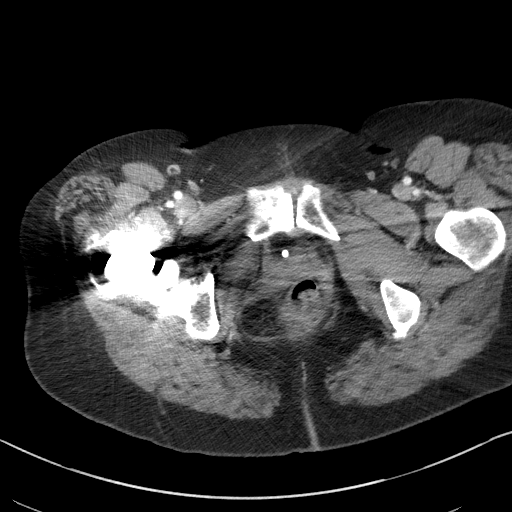
[im 17/96  soft-tissue]
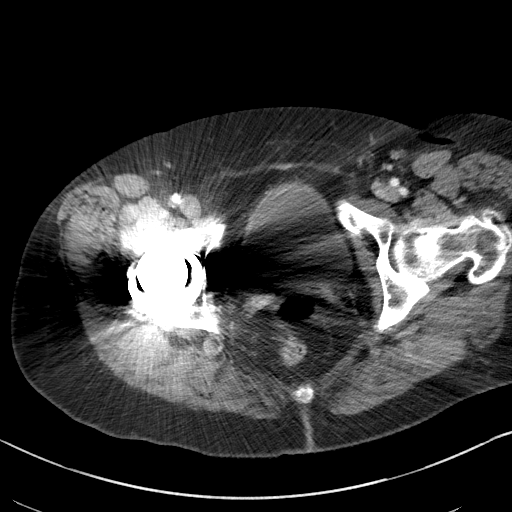
[im 28/96  soft-tissue]
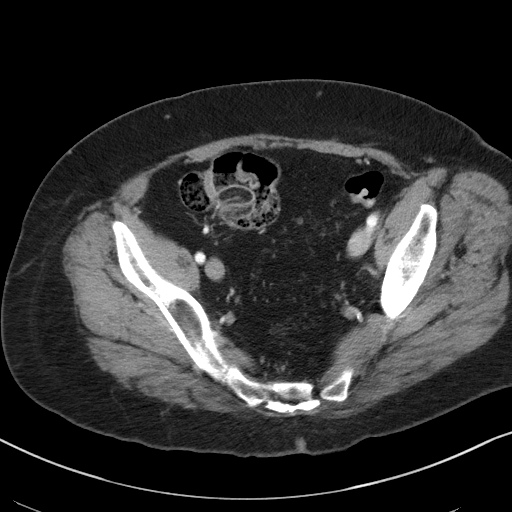
[im 34/96  soft-tissue]
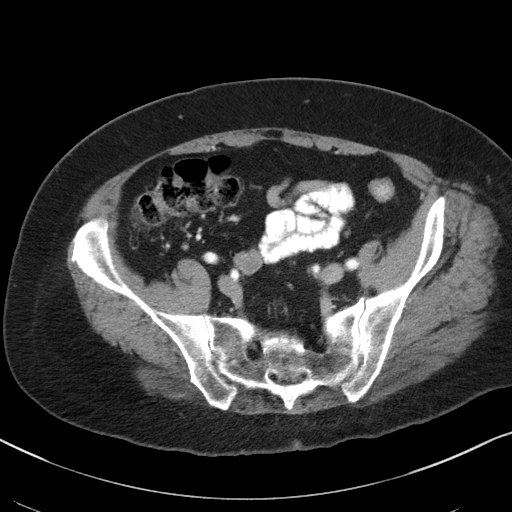
[im 40/96  soft-tissue]
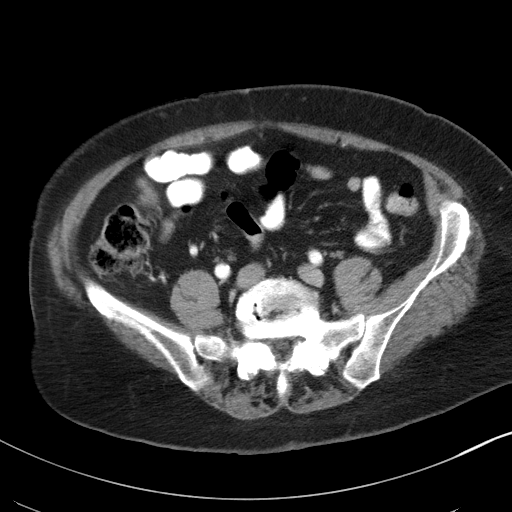
[im 45/96  soft-tissue]
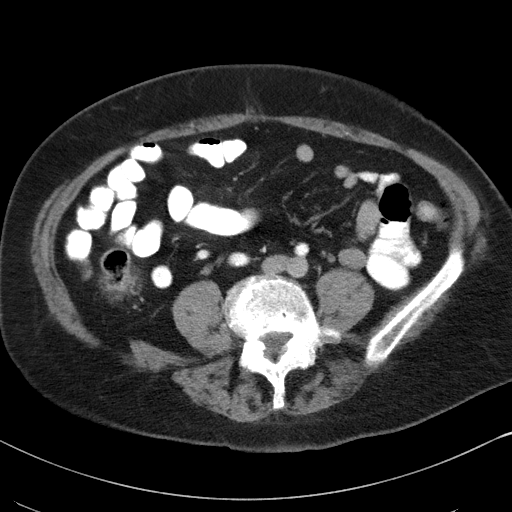
[im 51/96  soft-tissue]
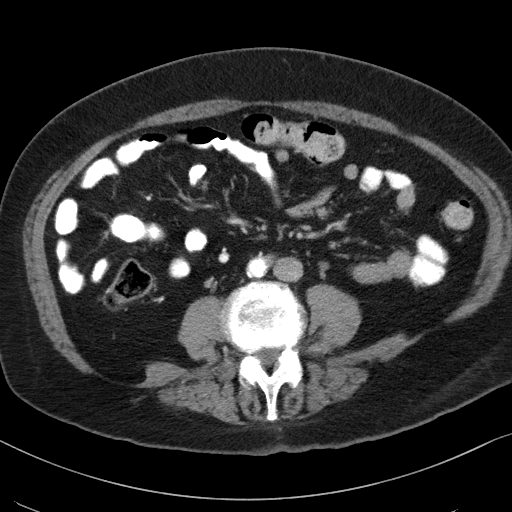
[im 56/96  soft-tissue]
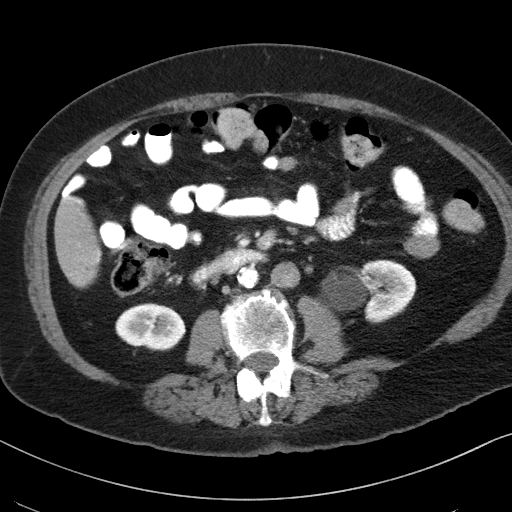
[im 56/96  bone]
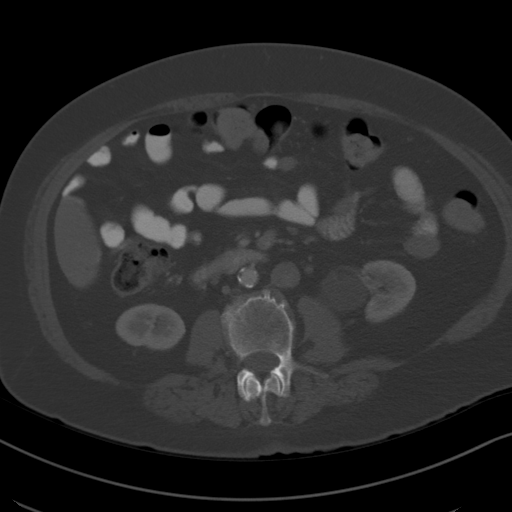
[im 62/96  soft-tissue]
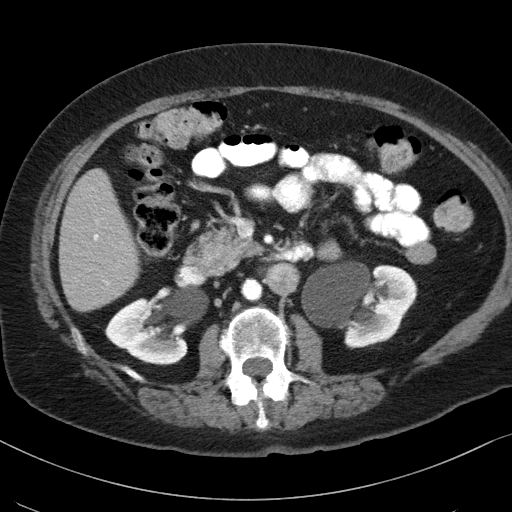
[im 73/96  soft-tissue]
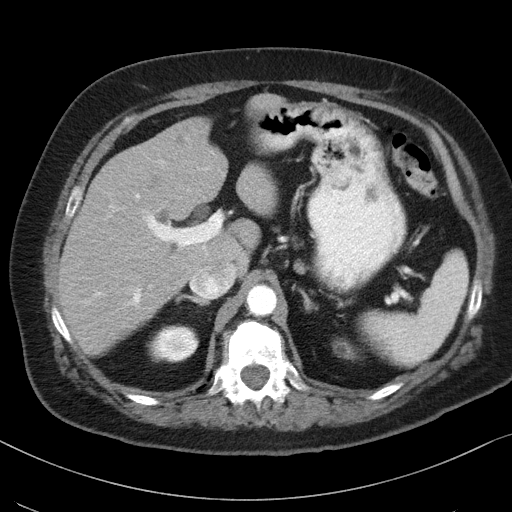
[im 79/96  soft-tissue]
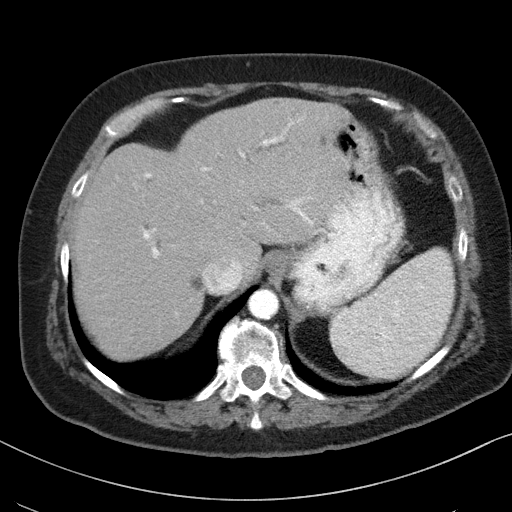
[im 84/96  soft-tissue]
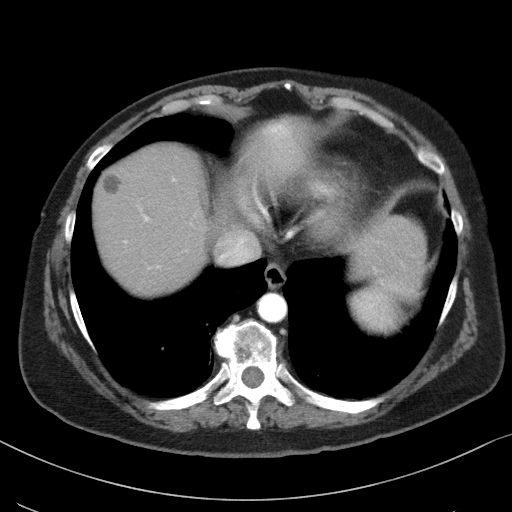
[im 90/96  soft-tissue]
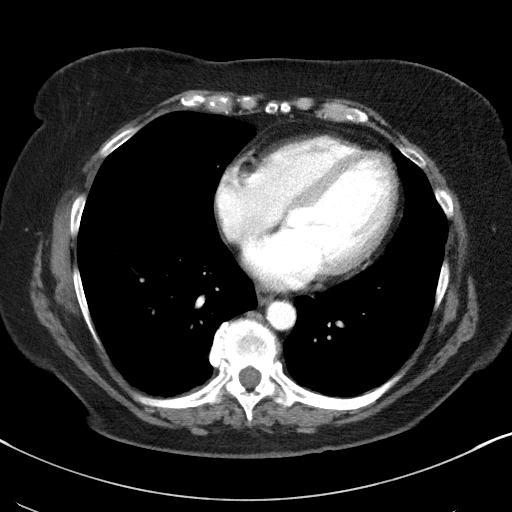

[Series 5: coronal st · coronal · 0.84mm/px · 3 of 103 slices shown]
[im 35/103  soft-tissue]
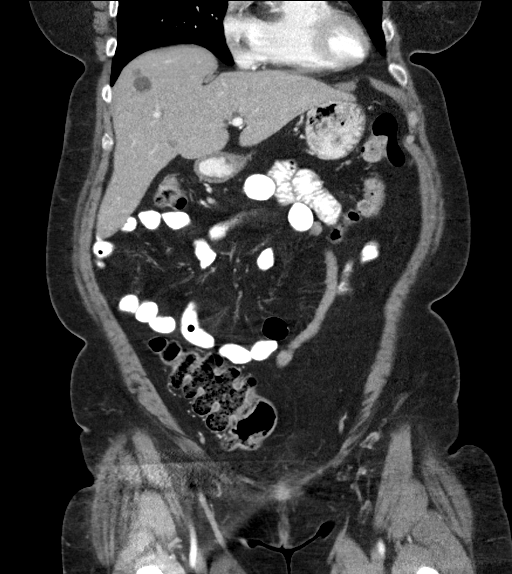
[im 46/103  soft-tissue]
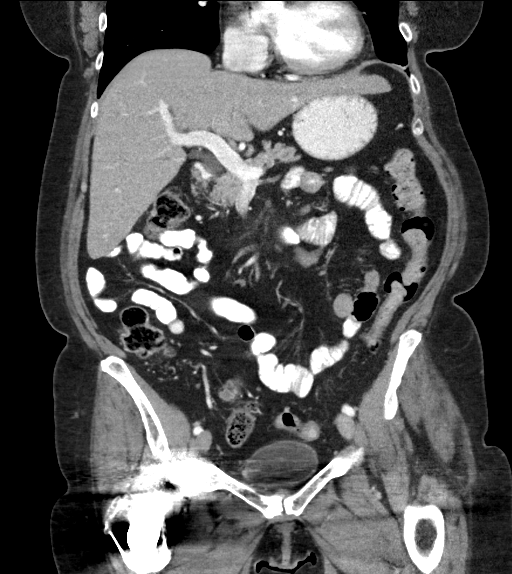
[im 57/103  soft-tissue]
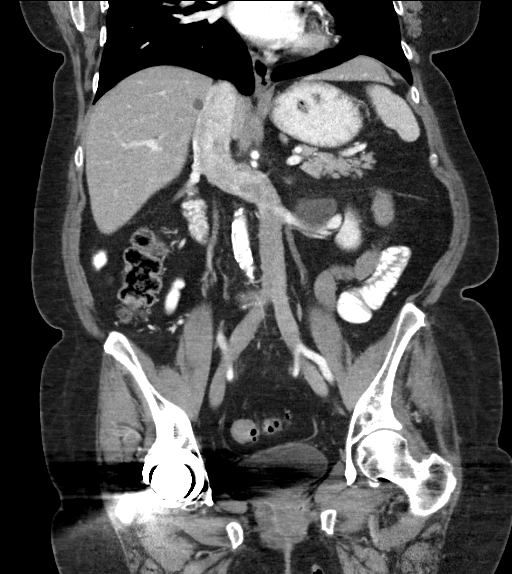

[17 of 46 positions shown; findings below may reference images not displayed]

FINDINGS: Lower chest: No acute abnormality.

Hepatobiliary: Hepatic cysts are again noted. The patient is status
post cholecystectomy. No biliary dilatation.

Pancreas: Unremarkable

Spleen: Unremarkable

Adrenals/Urinary Tract: The kidneys are unremarkable extra for
extrarenal pelvises. The adrenal glands and bladder are
unremarkable.

Stomach/Bowel: Stomach is within normal limits. No evidence of bowel
wall thickening, distention, or inflammatory changes.

Vascular/Lymphatic: Aortic atherosclerosis. No enlarged abdominal or
pelvic lymph nodes.

Reproductive: Status post hysterectomy. No adnexal masses.

Other: No ascites, focal collection or pneumoperitoneum.

Musculoskeletal: No acute or suspicious bony abnormalities. RIGHT
hip arthroplasty changes identified. Multilevel degenerative changes
throughout the lumbar spine again noted.
IMPRESSION: 1. No evidence of acute abnormality. No CT findings to suggest a
cause for this patient's LEFT abdominal pain.
2.  Aortic Atherosclerosis ([id number]).

## 2019-08-25 ENCOUNTER — Encounter: Payer: Self-pay | Admitting: Gastroenterology

## 2019-08-25 ENCOUNTER — Encounter: Payer: Self-pay | Admitting: Family Medicine

## 2020-01-20 ENCOUNTER — Other Ambulatory Visit (HOSPITAL_COMMUNITY): Payer: Self-pay | Admitting: Family Medicine

## 2020-01-20 DIAGNOSIS — Z1231 Encounter for screening mammogram for malignant neoplasm of breast: Secondary | ICD-10-CM

## 2020-03-05 ENCOUNTER — Encounter (HOSPITAL_COMMUNITY): Payer: Self-pay | Admitting: Emergency Medicine

## 2020-03-05 ENCOUNTER — Ambulatory Visit (HOSPITAL_COMMUNITY)
Admission: RE | Admit: 2020-03-05 | Discharge: 2020-03-05 | Disposition: A | Discharge status: Home / Self Care | Payer: Medicare Other | Source: Ambulatory Visit | Attending: Family Medicine | Admitting: Family Medicine

## 2020-03-05 ENCOUNTER — Other Ambulatory Visit: Payer: Self-pay

## 2020-03-05 ENCOUNTER — Emergency Department (HOSPITAL_COMMUNITY)
Admission: EM | Admit: 2020-03-05 | Discharge: 2020-03-05 | Disposition: A | Discharge status: Home / Self Care | Payer: Medicare Other | Attending: Emergency Medicine | Admitting: Emergency Medicine

## 2020-03-05 ENCOUNTER — Emergency Department (HOSPITAL_COMMUNITY): Payer: Medicare Other

## 2020-03-05 DIAGNOSIS — I10 Essential (primary) hypertension: Secondary | ICD-10-CM | POA: Diagnosis not present

## 2020-03-05 DIAGNOSIS — K589 Irritable bowel syndrome without diarrhea: Secondary | ICD-10-CM | POA: Insufficient documentation

## 2020-03-05 DIAGNOSIS — R0981 Nasal congestion: Secondary | ICD-10-CM | POA: Insufficient documentation

## 2020-03-05 DIAGNOSIS — N3001 Acute cystitis with hematuria: Secondary | ICD-10-CM | POA: Diagnosis not present

## 2020-03-05 DIAGNOSIS — N3000 Acute cystitis without hematuria: Secondary | ICD-10-CM | POA: Diagnosis not present

## 2020-03-05 DIAGNOSIS — Z1231 Encounter for screening mammogram for malignant neoplasm of breast: Secondary | ICD-10-CM

## 2020-03-05 DIAGNOSIS — J1282 Pneumonia due to coronavirus disease 2019: Secondary | ICD-10-CM | POA: Diagnosis not present

## 2020-03-05 DIAGNOSIS — M6281 Muscle weakness (generalized): Secondary | ICD-10-CM | POA: Diagnosis present

## 2020-03-05 DIAGNOSIS — R05 Cough: Secondary | ICD-10-CM | POA: Diagnosis not present

## 2020-03-05 DIAGNOSIS — Z87891 Personal history of nicotine dependence: Secondary | ICD-10-CM | POA: Insufficient documentation

## 2020-03-05 DIAGNOSIS — R197 Diarrhea, unspecified: Secondary | ICD-10-CM | POA: Insufficient documentation

## 2020-03-05 DIAGNOSIS — K219 Gastro-esophageal reflux disease without esophagitis: Secondary | ICD-10-CM | POA: Insufficient documentation

## 2020-03-05 DIAGNOSIS — E039 Hypothyroidism, unspecified: Secondary | ICD-10-CM | POA: Diagnosis not present

## 2020-03-05 DIAGNOSIS — U071 COVID-19: Secondary | ICD-10-CM | POA: Diagnosis not present

## 2020-03-05 DIAGNOSIS — E860 Dehydration: Secondary | ICD-10-CM | POA: Diagnosis not present

## 2020-03-05 DIAGNOSIS — J189 Pneumonia, unspecified organism: Secondary | ICD-10-CM | POA: Diagnosis not present

## 2020-03-05 DIAGNOSIS — K921 Melena: Secondary | ICD-10-CM | POA: Insufficient documentation

## 2020-03-05 LAB — COMPREHENSIVE METABOLIC PANEL
ALT: 32 U/L (ref 0–44)
AST: 64 U/L — ABNORMAL HIGH (ref 15–41)
Albumin: 3.8 g/dL (ref 3.5–5.0)
Alkaline Phosphatase: 45 U/L (ref 38–126)
Anion gap: 11 (ref 5–15)
BUN: 34 mg/dL — ABNORMAL HIGH (ref 8–23)
CO2: 21 mmol/L — ABNORMAL LOW (ref 22–32)
Calcium: 8.2 mg/dL — ABNORMAL LOW (ref 8.9–10.3)
Chloride: 105 mmol/L (ref 98–111)
Creatinine, Ser: 1.41 mg/dL — ABNORMAL HIGH (ref 0.44–1.00)
GFR calc Af Amer: 44 mL/min — ABNORMAL LOW (ref >60)
GFR calc non Af Amer: 38 mL/min — ABNORMAL LOW (ref >60)
Glucose, Bld: 122 mg/dL — ABNORMAL HIGH (ref 70–99)
Potassium: 3.6 mmol/L (ref 3.5–5.1)
Sodium: 137 mmol/L (ref 135–145)
Total Bilirubin: 0.5 mg/dL (ref 0.3–1.2)
Total Protein: 7.3 g/dL (ref 6.5–8.1)

## 2020-03-05 LAB — URINALYSIS, ROUTINE W REFLEX MICROSCOPIC
Bilirubin Urine: NEGATIVE
Glucose, UA: NEGATIVE mg/dL
Ketones, ur: NEGATIVE mg/dL
Nitrite: NEGATIVE
Protein, ur: 100 mg/dL — AB
Specific Gravity, Urine: 1.018 (ref 1.005–1.030)
WBC, UA: >50 WBC/hpf — ABNORMAL HIGH (ref 0–5)
pH: 5 (ref 5.0–8.0)

## 2020-03-05 LAB — SAMPLE TO BLOOD BANK

## 2020-03-05 LAB — CBC WITH DIFFERENTIAL/PLATELET
Abs Immature Granulocytes: 0.01 10*3/uL (ref 0.00–0.07)
Basophils Absolute: 0 10*3/uL (ref 0.0–0.1)
Basophils Relative: 0 %
Eosinophils Absolute: 0 10*3/uL (ref 0.0–0.5)
Eosinophils Relative: 0 %
HCT: 47.6 % — ABNORMAL HIGH (ref 36.0–46.0)
Hemoglobin: 15.7 g/dL — ABNORMAL HIGH (ref 12.0–15.0)
Immature Granulocytes: 0 %
Lymphocytes Relative: 27 %
Lymphs Abs: 1 10*3/uL (ref 0.7–4.0)
MCH: 30.3 pg (ref 26.0–34.0)
MCHC: 33 g/dL (ref 30.0–36.0)
MCV: 91.9 fL (ref 80.0–100.0)
Monocytes Absolute: 0.3 10*3/uL (ref 0.1–1.0)
Monocytes Relative: 7 %
Neutro Abs: 2.4 10*3/uL (ref 1.7–7.7)
Neutrophils Relative %: 66 %
Platelets: 125 10*3/uL — ABNORMAL LOW (ref 150–400)
RBC: 5.18 MIL/uL — ABNORMAL HIGH (ref 3.87–5.11)
RDW: 12.7 % (ref 11.5–15.5)
WBC: 3.6 10*3/uL — ABNORMAL LOW (ref 4.0–10.5)
nRBC: 0 % (ref 0.0–0.2)

## 2020-03-05 LAB — SARS CORONAVIRUS 2 BY RT PCR (HOSPITAL ORDER, PERFORMED IN ~~LOC~~ HOSPITAL LAB): SARS Coronavirus 2: POSITIVE — AB

## 2020-03-05 MED ORDER — ALBUTEROL SULFATE HFA 108 (90 BASE) MCG/ACT IN AERS
2.0000 | INHALATION_SPRAY | Freq: Once | RESPIRATORY_TRACT | Status: AC
  Administered 2020-03-05: 2 via RESPIRATORY_TRACT
  Filled 2020-03-05: qty 6.7

## 2020-03-05 MED ORDER — SODIUM CHLORIDE 0.9 % IV BOLUS: 500.0000 mL | Freq: Once | INTRAVENOUS | Status: DC

## 2020-03-05 MED ORDER — AEROCHAMBER Z-STAT PLUS/MEDIUM MISC: 1.0000 | Freq: Once | Status: DC

## 2020-03-05 MED ORDER — CEPHALEXIN 500 MG PO CAPS: 500.0000 mg | ORAL_CAPSULE | Freq: Two times a day (BID) | ORAL | 0 refills | Status: AC

## 2020-03-05 MED ORDER — SODIUM CHLORIDE 0.9% FLUSH: 3.0000 mL | Freq: Two times a day (BID) | INTRAVENOUS | Status: DC

## 2020-03-05 MED ORDER — SODIUM CHLORIDE 0.9 % IV BOLUS
500.0000 mL | Freq: Once | INTRAVENOUS | Status: AC
  Administered 2020-03-05: 500 mL via INTRAVENOUS

## 2020-03-05 MED ORDER — SODIUM CHLORIDE 0.9 % IV SOLN
1.0000 g | Freq: Once | INTRAVENOUS | Status: AC
  Administered 2020-03-05: 1 g via INTRAVENOUS
  Filled 2020-03-05: qty 10

## 2020-03-05 NOTE — Discharge Instructions (Addendum)
You do have Covid 19 and also have a urinary infection.  Take the antibiotics prescribed for your urinary infection until completed.  Rest and make sure you are drinking plenty of fluids.  Your labs suggest you are dehydrated, so have received IV fluids here.  Make sure you are increasing your home fluid intake.  Get rechecked if you develop any increased shortness of breath, fever, weakness or new symptoms.  You should have a basic metabolic panel rechecked within a week to make sure your lab tests are improving.  You may use the inhaler given -2 puffs every 4 hour to help with shortness of breath or coughing.   You will need to maintain home isolation until you are fever free for 3 days (you had no fever here).

## 2020-03-05 NOTE — ED Notes (Signed)
Date and time results received: 03/05/20 10:30 (use smartphrase ".now" to insert current time)  Test: covid positive Critical Value: covid positive Name of Provider Notified: Raynelle Fanning PA Orders Received? Or Actions Taken?:  None at present time

## 2020-03-05 NOTE — ED Provider Notes (Addendum)
[facility] EMERGENCY DEPARTMENT Provider Note   CSN: [id number] Arrival date & time: 03/05/20  4098     History No chief complaint on file.   Deanna Larsen is a 69 y.o. female with a history of GERD, htn, hypothyroidism and IBS presenting with generalized weakness, cough, congestion and red watery diarrhea which started approximately 10 days ago after [place] the mountains [place].  She reports about 4-5 episodes of diarrhea daily.  She denies URI sx including no nasal congestion, sore throat, ear pain, headache.  She also denies sob, cp and abdominal pain.  She is not covid vaccinated and denies any known exposures to this infection.  She has taken an otc cough medication which has not been helpful.  She denies fevers, chills, dysuria.    Of note, pt had EGD 03/31/19 revealing for mild gastritis.  10/16/16 - screening colonoscopy - Diverticulosis rectosigmoid and sigmoid, internal hemorrhoids.   HPI     Past Medical History:  Diagnosis Date  . Acid reflux   . Arthritis   . Hyperlipidemia   . Hypertension   . Hypothyroid   . IBS (irritable bowel syndrome)     Patient Active Problem List   Diagnosis Date Noted  . Dyspepsia 03/14/2019  . Melena 03/14/2019  . Essential hypertension, benign 03/11/2013  . Hypothyroidism 03/11/2013  . Esophageal reflux 03/11/2013  . Hyperlipidemia LDL goal <130 03/11/2013    Past Surgical History:  Procedure Laterality Date  . ABDOMINAL HYSTERECTOMY    . BILATERAL OOPHORECTOMY    . BIOPSY  03/25/2019   Procedure: BIOPSY;  Surgeon: Danie Binder, MD;  Location: [facility] ENDO SUITE;  Service: Endoscopy;;  gastric  . CHOLECYSTECTOMY    . COLONOSCOPY    . COLONOSCOPY N/A 10/16/2016   redundant rectosigmoid colon, multiple diverticula in rectosigmoid and sigmoid, internal hemorrhoids, repeat in 2028.   Marland Kitchen ESOPHAGOGASTRODUODENOSCOPY N/A 03/25/2019   Procedure: ESOPHAGOGASTRODUODENOSCOPY (EGD);  Surgeon: Danie Binder, MD;  Location: [facility]  ENDO SUITE;  Service: Endoscopy;  Laterality: N/A;  2:15PM  . HIP SURGERY Right   . JOINT REPLACEMENT    . SHOULDER SURGERY       OB History    Gravida  2   Para  2   Term  2   Preterm      AB      Living        SAB      TAB      Ectopic      Multiple      Live Births              Family History  Problem Relation Age of Onset  . Heart disease Father   . Diabetes Brother   . Colon cancer Neg Hx   . Colon polyps Neg Hx     Social History   Tobacco Use  . Smoking status: Former Smoker    Packs/day: 1.50    Years: 20.00    Pack years: 30.00    Quit date: 09/21/2005    Years since quitting: 14.4  . Smokeless tobacco: Never Used  Vaping Use  . Vaping Use: Never used  Substance Use Topics  . Alcohol use: No  . Drug use: No    Home Medications Prior to Admission medications   Medication Sig Start Date End Date Taking? Authorizing Provider  acetaminophen (TYLENOL) 500 MG tablet Take 500 mg by mouth every 6 (six) hours as needed for moderate pain or  headache.    [provider]  benazepril (LOTENSIN) 20 MG tablet TAKE 1 AND 1/2 TABLETS(30 MG) BY MOUTH DAILY 05/12/19   Mikey Kirschner, MD  calcium carbonate (TUMS - DOSED IN MG ELEMENTAL CALCIUM) 500 MG chewable tablet Chew 2 tablets by mouth 2 (two) times daily as needed for indigestion or heartburn.     [provider]  cephALEXin (KEFLEX) 500 MG capsule Take 1 capsule (500 mg total) by mouth 2 (two) times daily for 7 days. 03/05/20 03/02/2020  Evalee Jefferson, PA-C  levothyroxine (SYNTHROID) 137 MCG tablet Take 1 tablet (137 mcg total) by mouth daily. 05/12/19   Mikey Kirschner, MD  pantoprazole (PROTONIX) 40 MG tablet 1 po 30 mins prior to meals bid 05/12/19   Mikey Kirschner, MD  pravastatin (PRAVACHOL) 40 MG tablet Take 1 tablet (40 mg total) by mouth daily. 05/12/19   Mikey Kirschner, MD    Allergies    Augmentin [amoxicillin-pot clavulanate]  Review of Systems   Review of Systems   Constitutional: Positive for fatigue. Negative for fever.  HENT: Negative for congestion and sore throat.   Eyes: Negative.   Respiratory: Positive for cough. Negative for chest tightness and shortness of breath.   Cardiovascular: Negative for chest pain.  Gastrointestinal: Positive for blood in stool and diarrhea. Negative for abdominal pain and nausea.  Genitourinary: Negative.   Musculoskeletal: Negative for arthralgias, joint swelling and neck pain.  Skin: Negative.  Negative for rash and wound.  Neurological: Positive for weakness. Negative for dizziness, light-headedness, numbness and headaches.  Psychiatric/Behavioral: Negative.     Physical Exam Updated Vital Signs BP (!) 150/82 (BP Location: Right Arm)   Pulse 87   Temp 99.3 F (37.4 C) (Oral)   Resp 20   SpO2 96%   Physical Exam Vitals and nursing note reviewed. Exam conducted with a chaperone present.  Constitutional:      Appearance: She is well-developed.  HENT:     Head: Normocephalic and atraumatic.  Eyes:     Conjunctiva/sclera: Conjunctivae normal.  Cardiovascular:     Rate and Rhythm: Normal rate and regular rhythm.     Heart sounds: Normal heart sounds.  Pulmonary:     Effort: Pulmonary effort is normal. No respiratory distress.     Breath sounds: Normal breath sounds. No stridor. No wheezing, rhonchi or rales.  Abdominal:     General: Bowel sounds are normal.     Palpations: Abdomen is soft.     Tenderness: There is no abdominal tenderness.  Genitourinary:    Rectum: Guaiac result negative.     Comments: Patient is Hemoccult negative Musculoskeletal:        General: Normal range of motion.     Cervical back: Normal range of motion.  Skin:    General: Skin is warm and dry.  Neurological:     Mental Status: She is alert.     ED Results / Procedures / Treatments   Labs (all labs ordered are listed, but only abnormal results are displayed) Labs Reviewed  SARS CORONAVIRUS 2 BY RT PCR ([facility]) - Abnormal; Notable for the following components:      Result Value   SARS Coronavirus 2 POSITIVE (*)    All other components within normal limits  CBC WITH DIFFERENTIAL/PLATELET - Abnormal; Notable for the following components:   WBC 3.6 (*)    RBC 5.18 (*)    Hemoglobin 15.7 (*)  HCT 47.6 (*)    Platelets 125 (*)    All other components within normal limits  COMPREHENSIVE METABOLIC PANEL - Abnormal; Notable for the following components:   CO2 21 (*)    Glucose, Bld 122 (*)    BUN 34 (*)    Creatinine, Ser 1.41 (*)    Calcium 8.2 (*)    AST 64 (*)    GFR calc non Af Amer 38 (*)    GFR calc Af Amer 44 (*)    All other components within normal limits  URINALYSIS, ROUTINE W REFLEX MICROSCOPIC - Abnormal; Notable for the following components:   Color, Urine AMBER (*)    APPearance CLOUDY (*)    Hgb urine dipstick MODERATE (*)    Protein, ur 100 (*)    Leukocytes,Ua LARGE (*)    WBC, UA >50 (*)    Bacteria, UA MANY (*)    Non Squamous Epithelial 0-5 (*)    All other components within normal limits  URINE CULTURE  POC OCCULT BLOOD, ED  SAMPLE TO BLOOD BANK    EKG EKG Interpretation  Date/Time:  Monday March 05 2020 08:58:56 EDT Ventricular Rate:  100 PR Interval:  140 QRS Duration: 66 QT Interval:  338 QTC Calculation: 436 R Axis:   -51 Text Interpretation: Normal sinus rhythm Left anterior fascicular block Cannot rule out Anterior infarct , age undetermined Abnormal ECG Confirmed by Veryl Speak [phone]) on 03/05/2020 9:08:56 AM   Radiology DG Chest Portable 1 View  Result Date: 03/05/2020 CLINICAL DATA:  Cough, shortness of breath, COVID-19 positive EXAM: PORTABLE CHEST 1 VIEW COMPARISON:  01/29/2011 FINDINGS: The heart size and mediastinal contours are within normal limits. Atherosclerotic calcification of the aortic knob. Patchy interstitial opacities most pronounced in the peripheral aspect of the left upper lobe. No  pleural effusion or pneumothorax. Degenerative changes of the shoulders and thoracic spine. IMPRESSION: Patchy interstitial opacities most pronounced in the peripheral aspect of the left upper lobe. Findings most suggestive of atypical/viral pneumonia. Electronically Signed   By: Davina Poke D.O.   On: 03/05/2020 11:05    Procedures Procedures (including critical care time)  Medications Ordered in ED Medications  sodium chloride 0.9 % bolus 500 mL (has no administration in time range)  albuterol (VENTOLIN HFA) 108 (90 Base) MCG/ACT inhaler 2 puff (has no administration in time range)  aerochamber Z-Stat Plus/medium 1 each (has no administration in time range)  sodium chloride 0.9 % bolus 500 mL (0 mLs Intravenous Stopped 03/05/20 1121)  cefTRIAXone (ROCEPHIN) 1 g in sodium chloride 0.9 % 100 mL IVPB (0 g Intravenous Stopped 03/05/20 1225)    ED Course  I have reviewed the triage vital signs and the nursing notes.  Pertinent labs & imaging results that were available during my care of the patient were reviewed by me and considered in my medical decision making (see chart for details).    MDM Rules/Calculators/A&P                          Labs and imaging reviewed and discussed with patient.  She is Covid 19+, no hypoxia during ed visit, no indication for admission for this diagnosis.  UA is also positive for infection - when discussed this, pt also stating she has had "red" urine, no dysuria.  Dehydration with elevation in BUN and creatinine.  She was given IV fluids 1 L.  Also treated with rocephin for uti, urine cx ordered.  Patient is Hemoccult negative, suspect "red" stools are actually hematuria with her UTI.  Placed on Keflex for the next 7 days.  She has had 10 to 11 days since her Covid-like symptoms began.  I contacted the [facility] infusion clinic, patient is not a candidate for this treatment.  She was given albuterol MDI with instructions for home use.  Also given strict return  precautions.  We discussed her dehydration and need for increased fluid intake.  She was encouraged to get a recheck by her PCP within a week, including a repeat be met to ensure her kidney function and dehydration is improved.  ZIRA HELINSKI was evaluated in Emergency Department on 03/05/2020 for the symptoms described in the history of present illness. She was evaluated in the context of the global COVID-19 pandemic, which necessitated consideration that the patient might be at risk for infection with the SARS-CoV-2 virus that causes COVID-19. Institutional protocols and algorithms that pertain to the evaluation of patients at risk for COVID-19 are in a state of rapid change based on information released by regulatory bodies including the CDC and federal and state organizations. These policies and algorithms were followed during the patient's care in the ED.    Final Clinical Impression(s) / ED Diagnoses Final diagnoses:  Pneumonia due to COVID-19 virus  Acute cystitis with hematuria  Dehydration    Rx / DC Orders ED Discharge Orders         Ordered    cephALEXin (KEFLEX) 500 MG capsule  2 times daily     Discontinue  Reprint     03/05/20 1243           Evalee Jefferson, PA-C 03/05/20 1245    Evalee Jefferson, PA-C 03/05/20 1246    Veryl Speak, MD 03/05/20 1429

## 2020-03-05 NOTE — ED Triage Notes (Signed)
Patient states she went the mountains last week, since she has returned home, she states she has not felt right.  States her allergies have been worse and she feels week.

## 2020-03-07 LAB — URINE CULTURE

## 2020-03-09 ENCOUNTER — Emergency Department (HOSPITAL_COMMUNITY): Payer: Medicare Other

## 2020-03-09 ENCOUNTER — Encounter (HOSPITAL_COMMUNITY): Admission: EM | Disposition: E | Payer: Self-pay | Source: Home / Self Care | Attending: Pulmonary Disease

## 2020-03-09 ENCOUNTER — Other Ambulatory Visit: Payer: Self-pay

## 2020-03-09 ENCOUNTER — Encounter (HOSPITAL_COMMUNITY): Payer: Self-pay | Admitting: Emergency Medicine

## 2020-03-09 ENCOUNTER — Inpatient Hospital Stay (HOSPITAL_COMMUNITY)
Admission: EM | Admit: 2020-03-09 | Discharge: 2020-03-21 | DRG: 853 | Disposition: E | Payer: Medicare Other | Attending: Pulmonary Disease | Admitting: Pulmonary Disease

## 2020-03-09 DIAGNOSIS — I63412 Cerebral infarction due to embolism of left middle cerebral artery: Secondary | ICD-10-CM | POA: Diagnosis not present

## 2020-03-09 DIAGNOSIS — U071 COVID-19: Secondary | ICD-10-CM | POA: Diagnosis present

## 2020-03-09 DIAGNOSIS — K219 Gastro-esophageal reflux disease without esophagitis: Secondary | ICD-10-CM | POA: Diagnosis present

## 2020-03-09 DIAGNOSIS — R414 Neurologic neglect syndrome: Secondary | ICD-10-CM | POA: Diagnosis not present

## 2020-03-09 DIAGNOSIS — A4189 Other specified sepsis: Secondary | ICD-10-CM | POA: Diagnosis not present

## 2020-03-09 DIAGNOSIS — K589 Irritable bowel syndrome without diarrhea: Secondary | ICD-10-CM | POA: Diagnosis not present

## 2020-03-09 DIAGNOSIS — M1990 Unspecified osteoarthritis, unspecified site: Secondary | ICD-10-CM | POA: Diagnosis present

## 2020-03-09 DIAGNOSIS — Z515 Encounter for palliative care: Secondary | ICD-10-CM | POA: Diagnosis not present

## 2020-03-09 DIAGNOSIS — R918 Other nonspecific abnormal finding of lung field: Secondary | ICD-10-CM | POA: Diagnosis not present

## 2020-03-09 DIAGNOSIS — I82443 Acute embolism and thrombosis of tibial vein, bilateral: Secondary | ICD-10-CM | POA: Diagnosis present

## 2020-03-09 DIAGNOSIS — R29726 NIHSS score 26: Secondary | ICD-10-CM | POA: Diagnosis present

## 2020-03-09 DIAGNOSIS — Z833 Family history of diabetes mellitus: Secondary | ICD-10-CM

## 2020-03-09 DIAGNOSIS — D6869 Other thrombophilia: Secondary | ICD-10-CM | POA: Diagnosis present

## 2020-03-09 DIAGNOSIS — R069 Unspecified abnormalities of breathing: Secondary | ICD-10-CM | POA: Diagnosis not present

## 2020-03-09 DIAGNOSIS — Z8249 Family history of ischemic heart disease and other diseases of the circulatory system: Secondary | ICD-10-CM

## 2020-03-09 DIAGNOSIS — I959 Hypotension, unspecified: Secondary | ICD-10-CM | POA: Diagnosis present

## 2020-03-09 DIAGNOSIS — A419 Sepsis, unspecified organism: Secondary | ICD-10-CM | POA: Diagnosis present

## 2020-03-09 DIAGNOSIS — R471 Dysarthria and anarthria: Secondary | ICD-10-CM | POA: Diagnosis present

## 2020-03-09 DIAGNOSIS — J811 Chronic pulmonary edema: Secondary | ICD-10-CM | POA: Diagnosis not present

## 2020-03-09 DIAGNOSIS — Z431 Encounter for attention to gastrostomy: Secondary | ICD-10-CM | POA: Diagnosis not present

## 2020-03-09 DIAGNOSIS — Z79890 Hormone replacement therapy (postmenopausal): Secondary | ICD-10-CM

## 2020-03-09 DIAGNOSIS — Z9911 Dependence on respirator [ventilator] status: Secondary | ICD-10-CM | POA: Diagnosis not present

## 2020-03-09 DIAGNOSIS — I82453 Acute embolism and thrombosis of peroneal vein, bilateral: Secondary | ICD-10-CM | POA: Diagnosis not present

## 2020-03-09 DIAGNOSIS — I639 Cerebral infarction, unspecified: Secondary | ICD-10-CM

## 2020-03-09 DIAGNOSIS — H518 Other specified disorders of binocular movement: Secondary | ICD-10-CM | POA: Diagnosis present

## 2020-03-09 DIAGNOSIS — R4701 Aphasia: Secondary | ICD-10-CM | POA: Diagnosis present

## 2020-03-09 DIAGNOSIS — Z66 Do not resuscitate: Secondary | ICD-10-CM | POA: Diagnosis not present

## 2020-03-09 DIAGNOSIS — Z6831 Body mass index (BMI) 31.0-31.9, adult: Secondary | ICD-10-CM

## 2020-03-09 DIAGNOSIS — J80 Acute respiratory distress syndrome: Secondary | ICD-10-CM | POA: Diagnosis present

## 2020-03-09 DIAGNOSIS — K648 Other hemorrhoids: Secondary | ICD-10-CM | POA: Diagnosis present

## 2020-03-09 DIAGNOSIS — H510 Palsy (spasm) of conjugate gaze: Secondary | ICD-10-CM | POA: Diagnosis present

## 2020-03-09 DIAGNOSIS — R0689 Other abnormalities of breathing: Secondary | ICD-10-CM | POA: Diagnosis not present

## 2020-03-09 DIAGNOSIS — Z87891 Personal history of nicotine dependence: Secondary | ICD-10-CM

## 2020-03-09 DIAGNOSIS — I6340 Cerebral infarction due to embolism of unspecified cerebral artery: Secondary | ICD-10-CM | POA: Diagnosis present

## 2020-03-09 DIAGNOSIS — N1831 Chronic kidney disease, stage 3a: Secondary | ICD-10-CM | POA: Diagnosis present

## 2020-03-09 DIAGNOSIS — R6521 Severe sepsis with septic shock: Secondary | ICD-10-CM | POA: Diagnosis not present

## 2020-03-09 DIAGNOSIS — E785 Hyperlipidemia, unspecified: Secondary | ICD-10-CM | POA: Diagnosis not present

## 2020-03-09 DIAGNOSIS — I700 Atherosclerosis of aorta: Secondary | ICD-10-CM | POA: Diagnosis present

## 2020-03-09 DIAGNOSIS — Z881 Allergy status to other antibiotic agents status: Secondary | ICD-10-CM

## 2020-03-09 DIAGNOSIS — E876 Hypokalemia: Secondary | ICD-10-CM | POA: Diagnosis not present

## 2020-03-09 DIAGNOSIS — R739 Hyperglycemia, unspecified: Secondary | ICD-10-CM | POA: Diagnosis not present

## 2020-03-09 DIAGNOSIS — R4182 Altered mental status, unspecified: Secondary | ICD-10-CM | POA: Diagnosis not present

## 2020-03-09 DIAGNOSIS — I129 Hypertensive chronic kidney disease with stage 1 through stage 4 chronic kidney disease, or unspecified chronic kidney disease: Secondary | ICD-10-CM | POA: Diagnosis present

## 2020-03-09 DIAGNOSIS — Z79899 Other long term (current) drug therapy: Secondary | ICD-10-CM

## 2020-03-09 DIAGNOSIS — E039 Hypothyroidism, unspecified: Secondary | ICD-10-CM | POA: Diagnosis present

## 2020-03-09 DIAGNOSIS — J1282 Pneumonia due to coronavirus disease 2019: Secondary | ICD-10-CM | POA: Diagnosis present

## 2020-03-09 DIAGNOSIS — G936 Cerebral edema: Secondary | ICD-10-CM | POA: Diagnosis not present

## 2020-03-09 DIAGNOSIS — I63512 Cerebral infarction due to unspecified occlusion or stenosis of left middle cerebral artery: Secondary | ICD-10-CM | POA: Diagnosis not present

## 2020-03-09 DIAGNOSIS — I63233 Cerebral infarction due to unspecified occlusion or stenosis of bilateral carotid arteries: Secondary | ICD-10-CM | POA: Diagnosis not present

## 2020-03-09 DIAGNOSIS — G8191 Hemiplegia, unspecified affecting right dominant side: Secondary | ICD-10-CM | POA: Diagnosis not present

## 2020-03-09 DIAGNOSIS — E669 Obesity, unspecified: Secondary | ICD-10-CM | POA: Diagnosis present

## 2020-03-09 DIAGNOSIS — R0902 Hypoxemia: Secondary | ICD-10-CM | POA: Diagnosis not present

## 2020-03-09 DIAGNOSIS — R0602 Shortness of breath: Secondary | ICD-10-CM | POA: Diagnosis not present

## 2020-03-09 DIAGNOSIS — Z4659 Encounter for fitting and adjustment of other gastrointestinal appliance and device: Secondary | ICD-10-CM

## 2020-03-09 DIAGNOSIS — Z4682 Encounter for fitting and adjustment of non-vascular catheter: Secondary | ICD-10-CM | POA: Diagnosis not present

## 2020-03-09 HISTORY — PX: RADIOLOGY WITH ANESTHESIA: SHX6223

## 2020-03-09 LAB — CBC WITH DIFFERENTIAL/PLATELET
Abs Immature Granulocytes: 0.02 10*3/uL (ref 0.00–0.07)
Basophils Absolute: 0 10*3/uL (ref 0.0–0.1)
Basophils Relative: 0 %
Eosinophils Absolute: 0 10*3/uL (ref 0.0–0.5)
Eosinophils Relative: 0 %
HCT: 43.2 % (ref 36.0–46.0)
Hemoglobin: 14.5 g/dL (ref 12.0–15.0)
Immature Granulocytes: 0 %
Lymphocytes Relative: 14 %
Lymphs Abs: 0.7 10*3/uL (ref 0.7–4.0)
MCH: 30.5 pg (ref 26.0–34.0)
MCHC: 33.6 g/dL (ref 30.0–36.0)
MCV: 90.8 fL (ref 80.0–100.0)
Monocytes Absolute: 0.3 10*3/uL (ref 0.1–1.0)
Monocytes Relative: 7 %
Neutro Abs: 3.8 10*3/uL (ref 1.7–7.7)
Neutrophils Relative %: 79 %
Platelets: 198 10*3/uL (ref 150–400)
RBC: 4.76 MIL/uL (ref 3.87–5.11)
RDW: 13 % (ref 11.5–15.5)
WBC: 4.9 10*3/uL (ref 4.0–10.5)
nRBC: 0 % (ref 0.0–0.2)

## 2020-03-09 LAB — PROTIME-INR
INR: 1.1 (ref 0.8–1.2)
Prothrombin Time: 14.1 seconds (ref 11.4–15.2)

## 2020-03-09 LAB — COMPREHENSIVE METABOLIC PANEL
ALT: 34 U/L (ref 0–44)
AST: 51 U/L — ABNORMAL HIGH (ref 15–41)
Albumin: 2.9 g/dL — ABNORMAL LOW (ref 3.5–5.0)
Alkaline Phosphatase: 42 U/L (ref 38–126)
Anion gap: 11 (ref 5–15)
BUN: 24 mg/dL — ABNORMAL HIGH (ref 8–23)
CO2: 23 mmol/L (ref 22–32)
Calcium: 8.1 mg/dL — ABNORMAL LOW (ref 8.9–10.3)
Chloride: 110 mmol/L (ref 98–111)
Creatinine, Ser: 1.15 mg/dL — ABNORMAL HIGH (ref 0.44–1.00)
GFR calc Af Amer: 57 mL/min — ABNORMAL LOW (ref >60)
GFR calc non Af Amer: 49 mL/min — ABNORMAL LOW (ref >60)
Glucose, Bld: 145 mg/dL — ABNORMAL HIGH (ref 70–99)
Potassium: 3 mmol/L — ABNORMAL LOW (ref 3.5–5.1)
Sodium: 144 mmol/L (ref 135–145)
Total Bilirubin: 1 mg/dL (ref 0.3–1.2)
Total Protein: 6.5 g/dL (ref 6.5–8.1)

## 2020-03-09 LAB — FIBRINOGEN: Fibrinogen: 619 mg/dL — ABNORMAL HIGH (ref 210–475)

## 2020-03-09 LAB — TRIGLYCERIDES: Triglycerides: 134 mg/dL (ref <150)

## 2020-03-09 LAB — LACTIC ACID, PLASMA: Lactic Acid, Venous: 2.8 mmol/L (ref 0.5–1.9)

## 2020-03-09 LAB — FERRITIN: Ferritin: 1126 ng/mL — ABNORMAL HIGH (ref 11–307)

## 2020-03-09 LAB — C-REACTIVE PROTEIN: CRP: 8.9 mg/dL — ABNORMAL HIGH (ref <1.0)

## 2020-03-09 LAB — LACTATE DEHYDROGENASE: LDH: 631 U/L — ABNORMAL HIGH (ref 98–192)

## 2020-03-09 LAB — APTT: aPTT: 27 seconds (ref 24–36)

## 2020-03-09 LAB — D-DIMER, QUANTITATIVE: D-Dimer, Quant: 3.99 ug/mL-FEU — ABNORMAL HIGH (ref 0.00–0.50)

## 2020-03-09 LAB — PROCALCITONIN: Procalcitonin: 0.11 ng/mL

## 2020-03-09 LAB — ETHANOL: Alcohol, Ethyl (B): <10 mg/dL (ref <10)

## 2020-03-09 SURGERY — IR WITH ANESTHESIA
Anesthesia: General

## 2020-03-09 MED ORDER — ALTEPLASE (STROKE) FULL DOSE INFUSION
0.9000 mg/kg | Freq: Once | INTRAVENOUS | Status: AC
  Filled 2020-03-09: qty 100

## 2020-03-09 MED ORDER — IOHEXOL 350 MG/ML SOLN
150.0000 mL | Freq: Once | INTRAVENOUS | Status: AC | PRN
  Administered 2020-03-09: 100 mL via INTRAVENOUS

## 2020-03-09 MED ORDER — PROPOFOL 1000 MG/100ML IV EMUL: 5.0000 ug/kg/min | INTRAVENOUS | Status: DC

## 2020-03-09 MED ORDER — ETOMIDATE 2 MG/ML IV SOLN
20.0000 mg | Freq: Once | INTRAVENOUS | Status: AC
  Administered 2020-03-09: 20 mg via INTRAVENOUS

## 2020-03-09 MED ORDER — ALTEPLASE 100 MG IV SOLR
INTRAVENOUS | Status: AC
  Administered 2020-03-09: 75.5 mg via INTRAVENOUS
  Filled 2020-03-09: qty 100

## 2020-03-09 MED ORDER — LABETALOL HCL 5 MG/ML IV SOLN
INTRAVENOUS | Status: AC
  Filled 2020-03-09: qty 4

## 2020-03-09 MED ORDER — PROPOFOL 1000 MG/100ML IV EMUL
5.0000 ug/kg/min | INTRAVENOUS | Status: DC
  Administered 2020-03-09: 39.73 ug/kg/min via INTRAVENOUS
  Administered 2020-03-10: 50 ug/kg/min via INTRAVENOUS
  Administered 2020-03-10: 79.4597 ug/kg/min via INTRAVENOUS
  Administered 2020-03-10: 40 ug/kg/min via INTRAVENOUS
  Administered 2020-03-10: 79.4597 ug/kg/min via INTRAVENOUS
  Administered 2020-03-10: 10 ug/kg/min via INTRAVENOUS
  Administered 2020-03-10: 30 ug/kg/min via INTRAVENOUS
  Administered 2020-03-10 – 2020-03-11 (×5): 80 ug/kg/min via INTRAVENOUS
  Administered 2020-03-11: 60 ug/kg/min via INTRAVENOUS
  Administered 2020-03-11 (×3): 80 ug/kg/min via INTRAVENOUS
  Filled 2020-03-09 (×13): qty 100

## 2020-03-09 MED ORDER — SUCCINYLCHOLINE CHLORIDE 20 MG/ML IJ SOLN
120.0000 mg | Freq: Once | INTRAMUSCULAR | Status: AC
  Administered 2020-03-09: 120 mg via INTRAVENOUS

## 2020-03-09 MED ORDER — SODIUM CHLORIDE 0.9 % IV SOLN: 50.0000 mL | Freq: Once | INTRAVENOUS | Status: DC

## 2020-03-09 MED ORDER — FENTANYL CITRATE (PF) 100 MCG/2ML IJ SOLN
100.0000 ug | Freq: Once | INTRAMUSCULAR | Status: AC
  Administered 2020-03-09: 100 ug via INTRAVENOUS
  Filled 2020-03-09: qty 2

## 2020-03-09 MED ORDER — PROPOFOL 1000 MG/100ML IV EMUL
INTRAVENOUS | Status: AC
  Filled 2020-03-09: qty 100

## 2020-03-09 MED ORDER — ACETAMINOPHEN 650 MG RE SUPP
650.0000 mg | Freq: Once | RECTAL | Status: DC
  Filled 2020-03-09: qty 1

## 2020-03-09 NOTE — ED Notes (Signed)
Dr Estell Harpin and Molly Maduro, RT and this nurse in room for intubation

## 2020-03-09 NOTE — Progress Notes (Signed)
Beeper:  none Phone call @ 2140 Exam Started @2143  Exam Completed, Sent to [facility] and Radiologist called @ 2149

## 2020-03-09 NOTE — Progress Notes (Signed)
NeuroInterventional Radiology  Pre-Procedure Note  History: 69 yo female with COVID symptoms, COVID + on 8/16, presents to the [facility] ED for respiratory complaints.   She was witnessed to have acute left gaze deviation, aphasia, right hemiparesis.  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  She received tPA at [facility], and NIR was contacted.   Baseline mRS: 0 NIHSS:  26  CT ASPECTS: 10 CTA:   Left MCA M1 occlusion CTP:   Core infarct: 7cc, Estimated at risk territory: ~120cc   I have discussed the case with Dr. Derry Lory of Stroke Neurology.  Given the patient's symptoms, imaging findings, baseline function, we agree they are an appropriate candidate for attempt for mechanical thrombectomy.    The risks and benefits of the procedure were discussed with the patient/patient's family, with specific risks including: bleeding, infection, arterial injury/dissection, contrast reaction, kidney injury, need for further procedure/surgery, neurologic deficit, 10-15% risk of intracranial hemorrhage, cardiopulmonary collapse, death. All questions were answered.  The patient/family would like to proceed with attempt at thrombectomy.      Plan for cerebral angiogram and attempt at mechanical thrombectomy.   Signed,   Yvone Neu. Loreta Ave, DO

## 2020-03-09 NOTE — ED Triage Notes (Signed)
[facility] - pt here for SOB. Covid + on 8/16. Pt was speaking to staff, now unable to use or control right arm or leg, and eyes are deviating to the left. LKW 2120

## 2020-03-09 NOTE — ED Notes (Signed)
Symptoms started at 2120

## 2020-03-09 NOTE — ED Provider Notes (Signed)
[facility] EMERGENCY DEPARTMENT Provider Note   CSN: [id number] Arrival date & time: 03/08/2020  2052  An emergency department physician performed an initial assessment on this suspected stroke patient at 2137.  History Chief Complaint  Patient presents with   Shortness of Breath    covid +   Code Stroke    Deanna Larsen is a 69 y.o. female.  Patient presented with shortness of breath.  Patient was diagnosed with Covid couple days ago.  She presented to the emergency department short of breath and hypoxic  The history is provided by the patient and medical records. No language interpreter was used.  Shortness of Breath Severity:  Severe Onset quality:  Gradual Timing:  Constant Progression:  Worsening Context: not activity   Relieved by:  Nothing Worsened by:  Nothing Ineffective treatments:  None tried Associated symptoms: no abdominal pain, no chest pain, no cough, no headaches and no rash   Risk factors: no recent alcohol use        Past Medical History:  Diagnosis Date   Acid reflux    Arthritis    Hyperlipidemia    Hypertension    Hypothyroid    IBS (irritable bowel syndrome)     Patient Active Problem List   Diagnosis Date Noted   Dyspepsia 03/14/2019   Melena 03/14/2019   Essential hypertension, benign 03/11/2013   Hypothyroidism 03/11/2013   Esophageal reflux 03/11/2013   Hyperlipidemia LDL goal <130 03/11/2013    Past Surgical History:  Procedure Laterality Date   ABDOMINAL HYSTERECTOMY     BILATERAL OOPHORECTOMY     BIOPSY  03/25/2019   Procedure: BIOPSY;  Surgeon: West Bali, MD;  Location: [facility];  Service: Endoscopy;;  gastric   CHOLECYSTECTOMY     COLONOSCOPY     COLONOSCOPY N/A 10/16/2016   redundant rectosigmoid colon, multiple diverticula in rectosigmoid and sigmoid, internal hemorrhoids, repeat in 2028.    ESOPHAGOGASTRODUODENOSCOPY N/A 03/25/2019   Procedure: ESOPHAGOGASTRODUODENOSCOPY (EGD);   Surgeon: West Bali, MD;  Location: [facility];  Service: Endoscopy;  Laterality: N/A;  2:15PM   HIP SURGERY Right    JOINT REPLACEMENT     SHOULDER SURGERY       OB History    Gravida  2   Para  2   Term  2   Preterm      AB      Living        SAB      TAB      Ectopic      Multiple      Live Births              Family History  Problem Relation Age of Onset   Heart disease Father    Diabetes Brother    Colon cancer Neg Hx    Colon polyps Neg Hx     Social History   Tobacco Use   Smoking status: Former Smoker    Packs/day: 1.50    Years: 20.00    Pack years: 30.00    Quit date: 09/21/2005    Years since quitting: 14.4   Smokeless tobacco: Never Used  Vaping Use   Vaping Use: Never used  Substance Use Topics   Alcohol use: No   Drug use: No    Home Medications Prior to Admission medications   Medication Sig Start Date End Date Taking? Authorizing Provider  acetaminophen (TYLENOL) 500 MG tablet Take 500 mg by mouth every 6 (  six) hours as needed for moderate pain or headache.    [provider]  benazepril (LOTENSIN) 20 MG tablet TAKE 1 AND 1/2 TABLETS(30 MG) BY MOUTH DAILY 05/12/19   Merlyn Albert, MD  calcium carbonate (TUMS - DOSED IN MG ELEMENTAL CALCIUM) 500 MG chewable tablet Chew 2 tablets by mouth 2 (two) times daily as needed for indigestion or heartburn.     [provider]  cephALEXin (KEFLEX) 500 MG capsule Take 1 capsule (500 mg total) by mouth 2 (two) times daily for 7 days. 03/05/20 03/18/2020  Burgess Amor, PA-C  levothyroxine (SYNTHROID) 137 MCG tablet Take 1 tablet (137 mcg total) by mouth daily. 05/12/19   Merlyn Albert, MD  pantoprazole (PROTONIX) 40 MG tablet 1 po 30 mins prior to meals bid 05/12/19   Merlyn Albert, MD  pravastatin (PRAVACHOL) 40 MG tablet Take 1 tablet (40 mg total) by mouth daily. 05/12/19   Merlyn Albert, MD    Allergies    Augmentin [amoxicillin-pot  clavulanate]  Review of Systems   Review of Systems  Constitutional: Negative for appetite change and fatigue.  HENT: Negative for congestion, ear discharge and sinus pressure.   Eyes: Negative for discharge.  Respiratory: Positive for shortness of breath. Negative for cough.   Cardiovascular: Negative for chest pain.  Gastrointestinal: Negative for abdominal pain and diarrhea.  Genitourinary: Negative for frequency and hematuria.  Musculoskeletal: Negative for back pain.  Skin: Negative for rash.  Neurological: Negative for seizures and headaches.  Psychiatric/Behavioral: Negative for hallucinations.    Physical Exam Updated Vital Signs BP (!) 120/51    Pulse 83    Resp (!) 26    Ht 5\' 5"  (1.651 m)    Wt 83.9 kg    SpO2 92%    BMI 30.78 kg/m   Physical Exam Vitals and nursing note reviewed.  Constitutional:      Appearance: She is well-developed.     Comments: Lethargic  HENT:     Head: Normocephalic.     Nose: Nose normal.  Eyes:     General: No scleral icterus.    Conjunctiva/sclera: Conjunctivae normal.  Neck:     Thyroid: No thyromegaly.  Cardiovascular:     Rate and Rhythm: Normal rate and regular rhythm.     Heart sounds: No murmur heard.  No friction rub. No gallop.   Pulmonary:     Effort: Respiratory distress present.     Breath sounds: No stridor. No wheezing or rales.  Chest:     Chest wall: No tenderness.  Abdominal:     General: There is no distension.     Tenderness: There is no abdominal tenderness. There is no rebound.  Musculoskeletal:        General: Normal range of motion.     Cervical back: Neck supple.  Lymphadenopathy:     Cervical: No cervical adenopathy.  Skin:    Findings: No erythema or rash.  Neurological:     Motor: No abnormal muscle tone.     Coordination: Coordination normal.     Comments: Lethargic but oriented to person place     ED Results / Procedures / Treatments   Labs (all labs ordered are listed, but only abnormal  results are displayed) Labs Reviewed  LACTIC ACID, PLASMA - Abnormal; Notable for the following components:      Result Value   Lactic Acid, Venous 2.8 (*)    All other components within normal limits  COMPREHENSIVE METABOLIC PANEL -  Abnormal; Notable for the following components:   Potassium 3.0 (*)    Glucose, Bld 145 (*)    BUN 24 (*)    Creatinine, Ser 1.15 (*)    Calcium 8.1 (*)    Albumin 2.9 (*)    AST 51 (*)    GFR calc non Af Amer 49 (*)    GFR calc Af Amer 57 (*)    All other components within normal limits  D-DIMER, QUANTITATIVE (NOT AT [facility]) - Abnormal; Notable for the following components:   D-Dimer, Quant 3.99 (*)    All other components within normal limits  LACTATE DEHYDROGENASE - Abnormal; Notable for the following components:   LDH 631 (*)    All other components within normal limits  FERRITIN - Abnormal; Notable for the following components:   Ferritin 1,126 (*)    All other components within normal limits  FIBRINOGEN - Abnormal; Notable for the following components:   Fibrinogen 619 (*)    All other components within normal limits  C-REACTIVE PROTEIN - Abnormal; Notable for the following components:   CRP 8.9 (*)    All other components within normal limits  CULTURE, BLOOD (ROUTINE X 2)  CULTURE, BLOOD (ROUTINE X 2)  CBC WITH DIFFERENTIAL/PLATELET  TRIGLYCERIDES  ETHANOL  PROTIME-INR  APTT  LACTIC ACID, PLASMA  PROCALCITONIN  RAPID URINE DRUG SCREEN, HOSP PERFORMED  URINALYSIS, ROUTINE W REFLEX MICROSCOPIC  I-STAT CHEM 8, ED    EKG None  Radiology CT Angio Head W or Wo Contrast  Result Date: 02/28/2020 CLINICAL DATA:  Coronavirus infection. Acute presentation with inability to speak. EXAM: CT ANGIOGRAPHY HEAD AND NECK CT PERFUSION BRAIN TECHNIQUE: Multidetector CT imaging of the head and neck was performed using the standard protocol during bolus administration of intravenous contrast. Multiplanar CT image reconstructions and MIPs were obtained  to evaluate the vascular anatomy. Carotid stenosis measurements (when applicable) are obtained utilizing NASCET criteria, using the distal internal carotid diameter as the denominator. Multiphase CT imaging of the brain was performed following IV bolus contrast injection. Subsequent parametric perfusion maps were calculated using RAPID software. CONTRAST:  100mL OMNIPAQUE IOHEXOL 350 MG/ML SOLN COMPARISON:  None. FINDINGS: CTA NECK FINDINGS Aortic arch: Aortic atherosclerosis. Branching pattern is normal without origin stenosis. Right carotid system: Common carotid artery widely patent to the bifurcation. Calcified plaque in the ICA bulb with stenosis of 20%. Cervical ICA widely patent beyond that. Left carotid system: Common carotid artery widely patent to the bifurcation. Minimal calcified plaque in the distal bulb but no stenosis. Cervical ICA widely patent beyond that. Vertebral arteries: Both vertebral arteries widely patent at their origins and through the cervical region to the foramen magnum. Skeleton: Ordinary spondylosis. Other neck: No mass or lymphadenopathy. Upper chest: Bilateral pulmonary infiltrates consistent with coronavirus pneumonia. Review of the MIP images confirms the above findings CTA HEAD FINDINGS Anterior circulation: Both internal carotid arteries are patent through the skull base and siphon regions. On the left, there is complete occlusion of the M1 segment. There appear to be poor collaterals. Left anterior cerebral artery shows flow. On the right, the anterior and middle cerebral arteries show flow. Posterior circulation: Both vertebral arteries patent to the basilar. No basilar stenosis. Posterior circulation branch vessels show flow. Venous sinuses: Patent and normal. Anatomic variants: None significant. 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 7 mL core infarction in the insula. 150 cc penumbra throughout the remainder of the MCA territory. This number is probably exaggerated because of inclusion of some areas not in the right MCA territory. True penumbra is probably in the range of 100-120 cc. Aortic atherosclerosis. No significant carotid bifurcation stenosis. These results were called by telephone during interpretation on 02/24/2020 at 10:16 pm to provider [facility] Shanyn Preisler , who verbally acknowledged these results. Electronically Signed   By: Paulina Fusi [name]   On: 02/20/2020 22:31   CT Angio Neck W and/or Wo Contrast  Result Date: 02/28/2020 CLINICAL DATA:  Coronavirus infection. Acute presentation with inability to speak. EXAM: CT ANGIOGRAPHY HEAD AND NECK CT PERFUSION BRAIN TECHNIQUE: Multidetector CT imaging of the head and neck was performed using the standard protocol during bolus administration of intravenous contrast. Multiplanar CT image reconstructions and MIPs were obtained to evaluate the vascular anatomy. Carotid stenosis measurements (when applicable) are obtained utilizing NASCET criteria, using the distal internal carotid diameter as the denominator. Multiphase CT imaging of the brain was performed following IV bolus contrast injection. Subsequent parametric perfusion maps were calculated using RAPID software. CONTRAST:  OMNIPAQUE IOHEXOL 350 MG/ML SOLN COMPARISON:  None. FINDINGS: CTA NECK FINDINGS Aortic arch: Aortic atherosclerosis. Branching pattern is normal without origin stenosis. Right carotid system: Common carotid artery widely patent to the bifurcation. Calcified plaque in the ICA bulb with stenosis of 20%. Cervical ICA widely patent beyond that. Left carotid system: Common carotid artery widely patent to the bifurcation. Minimal calcified plaque in the distal bulb but no stenosis. Cervical ICA  widely patent beyond that. Vertebral arteries: Both vertebral arteries widely patent at their origins and through the cervical region to the foramen magnum. Skeleton: Ordinary spondylosis. Other neck: No mass or lymphadenopathy. Upper chest: Bilateral pulmonary infiltrates consistent with coronavirus pneumonia. Review of the MIP images confirms the above findings CTA HEAD FINDINGS Anterior circulation: Both internal carotid arteries are patent through the skull base and siphon regions. On the left, there is complete occlusion of the M1 segment. There appear to be poor collaterals. Left anterior cerebral artery shows flow. On the right, the anterior and middle cerebral arteries show flow. Posterior circulation: Both vertebral arteries patent to the basilar. No basilar stenosis. Posterior circulation branch vessels show flow. Venous sinuses: Patent and normal. Anatomic variants: None significant. 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 7 mL core infarction in the insula. 150 cc penumbra throughout the remainder of the MCA territory. This number is probably exaggerated because of inclusion of some areas not in the right MCA territory. True penumbra is probably in the range of 100-120 cc. Aortic atherosclerosis. No significant carotid bifurcation stenosis. These results were called by telephone during interpretation on 02/21/2020 at 10:16 pm to provider [facility] Kara Melching , who verbally acknowledged these results. Electronically Signed   By: Paulina Fusi [name]   On: 02/26/2020 22:31   CT CEREBRAL PERFUSION W CONTRAST  Result Date: 03/03/2020 CLINICAL DATA:  Coronavirus infection. Acute presentation with inability to speak. EXAM: CT ANGIOGRAPHY HEAD AND NECK CT PERFUSION  BRAIN TECHNIQUE: Multidetector CT imaging of the head and neck was performed using the standard protocol during bolus administration of intravenous contrast. Multiplanar CT image reconstructions and MIPs  were obtained to evaluate the vascular anatomy. Carotid stenosis measurements (when applicable) are obtained utilizing NASCET criteria, using the distal internal carotid diameter as the denominator. Multiphase CT imaging of the brain was performed following IV bolus contrast injection. Subsequent parametric perfusion maps were calculated using RAPID software. CONTRAST:  OMNIPAQUE IOHEXOL 350 MG/ML SOLN COMPARISON:  None. FINDINGS: CTA NECK FINDINGS Aortic arch: Aortic atherosclerosis. Branching pattern is normal without origin stenosis. Right carotid system: Common carotid artery widely patent to the bifurcation. Calcified plaque in the ICA bulb with stenosis of 20%. Cervical ICA widely patent beyond that. Left carotid system: Common carotid artery widely patent to the bifurcation. Minimal calcified plaque in the distal bulb but no stenosis. Cervical ICA widely patent beyond that. Vertebral arteries: Both vertebral arteries widely patent at their origins and through the cervical region to the foramen magnum. Skeleton: Ordinary spondylosis. Other neck: No mass or lymphadenopathy. Upper chest: Bilateral pulmonary infiltrates consistent with coronavirus pneumonia. Review of the MIP images confirms the above findings CTA HEAD FINDINGS Anterior circulation: Both internal carotid arteries are patent through the skull base and siphon regions. On the left, there is complete occlusion of the M1 segment. There appear to be poor collaterals. Left anterior cerebral artery shows flow. On the right, the anterior and middle cerebral arteries show flow. Posterior circulation: Both vertebral arteries patent to the basilar. No basilar stenosis. Posterior circulation branch vessels show flow. Venous sinuses: Patent and normal.  Anatomic variants: None significant. 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 7 mL core infarction in the insula. 150 cc penumbra throughout the remainder of the MCA territory. This number is probably exaggerated because of inclusion of some areas not in the right MCA territory. True penumbra is probably in the range of 100-120 cc. Aortic atherosclerosis. No significant carotid bifurcation stenosis. These results were called by telephone during interpretation on 03/07/2020 at 10:16 pm to provider [facility] Nehan Flaum , who verbally acknowledged these results. Electronically Signed   By: Paulina Fusi [name]   On: 03/18/2020 22:31   DG Chest Port 1 View  Result Date: 02/27/2020 CLINICAL DATA:  COVID-19 positive 03/05/2020, altered level of consciousness, shortness of breath EXAM: PORTABLE CHEST 1 VIEW COMPARISON:  03/05/2020 FINDINGS: Single frontal view of the chest demonstrates an unremarkable cardiac silhouette. There is increased interstitial prominence throughout the lungs, with developing bilateral ground-glass airspace disease. No effusion or pneumothorax. No acute bony abnormalities. IMPRESSION: 1. Worsening interstitial and ground-glass opacities, which may reflect multifocal pneumonia or edema. Electronically Signed   By: Sharlet Salina [name]   On: 03/02/2020 22:44   CT HEAD CODE STROKE WO CONTRAST  Result Date: 03/02/2020 CLINICAL DATA:  Code stroke. Coronavirus infection. Febrile. Altered mental status. EXAM: CT HEAD WITHOUT CONTRAST TECHNIQUE: Contiguous axial images were obtained from the base of the skull through the vertex without intravenous contrast. COMPARISON:  None. FINDINGS: Brain: The study suffers from some motion degradation. There is mild age  related volume loss. No sign of acute infarction, mass lesion, hemorrhage, hydrocephalus or extra-axial collection. Vascular: There is atherosclerotic calcification of the major vessels at the base of the brain. Skull: Negative Sinuses/Orbits: Clear/normal Other: None ASPECTS (Alberta Stroke Program Early CT Score) - Ganglionic level infarction (caudate, lentiform nuclei, internal capsule, insula, M1-M3 cortex): 7 - Supraganglionic infarction (  M4-M6 cortex): 3 Total score (0-10 with 10 being normal): 10 IMPRESSION: 1. Mild age related volume loss.  Otherwise normal head CT. 2. ASPECTS is 10 3. These results were called by telephone at the time of interpretation on 04/06/20 at 9:53 pm to provider  [facility] Edrie Ehrich , who verbally acknowledged these results. Electronically Signed   By: Paulina Fusi [name]   On: April 06, 2020 21:57    Procedures Procedure Name: Intubation Date/Time: 03/03/2020 12:06 PM Performed by: Bethann Berkshire, MD Pre-anesthesia Checklist: Suction available, Emergency Drugs available, Patient being monitored and Patient identified Oxygen Delivery Method: Non-rebreather mask Preoxygenation: Pre-oxygenation with 100% oxygen Induction Type: IV induction and Rapid sequence Ventilation: Mask ventilation without difficulty Laryngoscope Size: Glidescope Grade View: Grade II Tube type: Subglottic suction tube Tube size: 7.5 mm Number of attempts: 1 Placement Confirmation: ETT inserted through vocal cords under direct vision and Positive ETCO2 Tube secured with: Tape Difficulty Due To: Difficulty was anticipated Comments: Patient was given succinylcholine and etomidate.  Glide scope was used to intubate the patient without difficulties.      (including critical care time)  Medications Ordered in ED Medications  labetalol (NORMODYNE) 5 MG/ML injection (has no administration in time range)  alteplase (ACTIVASE) 1 mg/mL infusion 75.5 mg (75.5 mg Intravenous New Bag/Given Apr 06, 2020 2215)     Followed by  0.9 %  sodium chloride infusion (has no administration in time range)  acetaminophen (TYLENOL) suppository 650 mg (has no administration in time range)  iohexol (OMNIPAQUE) 350 MG/ML injection 150 mL (100 mLs Intravenous Contrast Given 2020/04/06 2218)  etomidate (AMIDATE) injection 20 mg (20 mg Intravenous Given 04/06/2020 2305)  succinylcholine (ANECTINE) injection 120 mg (120 mg Intravenous Given 2020-04-06 2305)   CRITICAL CARE Performed by: Bethann Berkshire Total critical care time: 40 minutes Critical care time was exclusive of separately billable procedures and treating other patients. Critical care was necessary to treat or prevent imminent or life-threatening deterioration. Critical care was time spent personally by me on the following activities: development of treatment plan with patient and/or surrogate as well as nursing, discussions with consultants, evaluation of patient's response to treatment, examination of patient, obtaining history from patient or surrogate, ordering and performing treatments and interventions, ordering and review of laboratory studies, ordering and review of radiographic studies, pulse oximetry and re-evaluation of patient's condition.     While patient was in the emergency department she became more confused and unable to use her right side and also patient had aphasia and left-sided gaze. code stroke was called.  Patient had up with a large occluded vessel on the left side.  She was given TPA and arrangement was made for the patient to go to [facility] to see the interventional list and be admitted by neurology.  Patient was intubated prior to transfer      This patient presents to the ED for concern of shortness of breath, this involves an extensive number of treatment options, and is a complaint that carries with it a high risk of complications and morbidity.  The differential diagnosis includes Covid infection   Lab Tests:   I Ordered,  reviewed, and interpreted labs, which included CBC chemistries that showed low potassium dehydration  Medicines ordered:   I ordered medication etomidate and succinylcholine  Imaging Studies ordered:   I ordered imaging studies which included chest x-ray which showed interstitial infiltrates with ET tube down the right main bronchus  I independently visualized and interpreted imaging   Additional history obtained:  Additional history obtained from records  Previous records obtained and reviewed.  Consultations Obtained:   I consulted neurology and discussed lab and imaging findings  Reevaluation:  After the interventions stated above, I reevaluated the patient and found not improved  Critical Interventions:      ED Course  I have reviewed the triage vital signs and the nursing notes.  Pertinent labs & imaging results that were available during my care of the patient were reviewed by me and considered in my medical decision making (see chart for details).    MDM Rules/Calculators/A&P                           Final Clinical Impression(s) / ED Diagnoses Final diagnoses:  None    Rx / DC Orders ED Discharge Orders    None       Bethann Berkshire, MD 03/02/2020 1212

## 2020-03-09 NOTE — Anesthesia Preprocedure Evaluation (Addendum)
Anesthesia Evaluation  Patient identified by MRN, date of birth, ID band Patient unresponsive  General Assessment Comment:Pt intubated and sedated at [facility]  Reviewed: Allergy & Precautions, Patient's Chart, lab work & pertinent test results, Unable to perform ROS - Chart review onlyPreop documentation limited or incomplete due to emergent nature of procedure.  History of Anesthesia Complications Negative for: history of anesthetic complications  Airway Mallampati: Intubated       Dental   Pulmonary shortness of breath, former smoker,  03/05/2020 SARS coronavirus POSITIVE Presented to ED with weakness and shortness of breath: to be intubated prior to transport to [facility] IR  CXR: diffuse pulm infiltrates   breath sounds clear to auscultation   + intubated    Cardiovascular hypertension, Pt. on medications  Rhythm:Regular Rate:Normal     Neuro/Psych 21:20 left gaze deviation, aphasia, right hemiparesis, large M1 occlusion while in the [facility] ED for shortness of breath    GI/Hepatic Neg liver ROS, GERD  Controlled,  Endo/Other  Hypothyroidism obese  Renal/GU Renal InsufficiencyRenal disease     Musculoskeletal   Abdominal (+) + obese,   Peds  Hematology negative hematology ROS (+)   Anesthesia Other Findings   Reproductive/Obstetrics                           Anesthesia Physical Anesthesia Plan  ASA: IV and emergent  Anesthesia Plan: General   Post-op Pain Management:    Induction: Inhalational and Intravenous  PONV Risk Score and Plan: 3 and Treatment may vary due to age or medical condition  Airway Management Planned: Oral ETT  Additional Equipment: Arterial line  Intra-op Plan:   Post-operative Plan: Post-operative intubation/ventilation  Informed Consent: I have reviewed the patients History and Physical, chart, labs and discussed the procedure including the risks, benefits and  alternatives for the proposed anesthesia with the patient or authorized representative who has indicated his/her understanding and acceptance.     Consent reviewed with POA  Plan Discussed with: CRNA and Surgeon  Anesthesia Plan Comments: (Discussed with patient's daughter by telephone)       Anesthesia Quick Evaluation

## 2020-03-09 NOTE — Consult Note (Signed)
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 CT head was unremarkable for any acute findings although showed concern for potential hyperdense M2 branches. CT angiogram of the head shows evidence of a left M1 occlusion with a large penumbra on CT perfusion. Spoke with the daughter, and confirmed no contraindications thrombolytics. Therefore, she was given IV TPA. Plan for transfer for attempted mechanical thrombectomy as well. Coags are still pending; if PT is greater than 15, PTT greater than 40, or INR greater than 1.7, consider stopping the TPA if still deficient. Blood pressure goal less than 180/105 after IV TPA. She will need a full stroke work-up at the outside facility.  Metrics: Last Known Well: 2020-04-04 21:20:00 [facility] Notification Time: 04-04-2020 21:47:55 Symptom Onset During ED Stay: April 04, 2020 21:53:08 Stamp Time: 04-04-20 21:47:55 Time First Login Attempt: Apr 04, 2020 21:48:36 Symptoms: Left MCA stroke syndrome. NIHSS Start Assessment Time: 2020/04/04 22:13:30 Thrombolytic Early Mix Decision Time: 04/04/20 22:00:43 Patient is a candidate for Thrombolytic. Thrombolytic Medical Decision: 04-04-20 21:53:02 Thrombolytic CPOE Order Time: Apr 04, 2020 22:00:39 Needle Time: 2020/04/04 22:15:24 Weight Noted by Staff: 83.9 kg  CT head showed no acute hemorrhage or acute core infarct.  Advanced Imaging: CTA Head and Neck Completed.  CTP Completed.  LVO:Yes  Discussed with NIR:Yes  Discussed with NIR Time:04-Apr-2020 22:25:26  Discussed with NIR Text:  Left M1 occlusion with large penumbra. NIR  agreeable to attempt at [facility]. Plan for transfer.   Thrombolytic Contraindications:  Last Known Well > 4.5 hours: No CT Head showing hemorrhage: No Ischemic stroke within 3 months: No Severe head trauma within 3 months: No Intracranial/intraspinal surgery within 3 months: No History of intracranial hemorrhage: No Symptoms and signs consistent with an SAH: No GI malignancy or GI bleed within 21 days: No Coagulopathy: Platelets <100 000/mm3, INR >1.7, aPTT>40 s, or PT >15 s: No Treatment dose of LMWH within the previous 24 hrs: No Use of NOACs in past 48 hours: No Glycoprotein IIb/IIIa receptor inhibitors use: No Symptoms consistent with infective endocarditis: No Suspected aortic arch dissection: No Intra-axial intracranial neoplasm: No  Verbal Consent to Thrombolytic: I have explained to the Family the nature of the patients condition, reviewed the indications and contraindications to the use of Thrombolytic fibrinolytic agent, reviewed the indications and contraindications and the benefits to be reasonably expected compared with alternative approaches. I have discussed the likelihood of major risks or complications of this procedure including (if applicable) but not limited to loss of limb function, brain damage, paralysis, hemorrhage, infection, complications from transfusion of blood components, drug reactions, blood clots and loss of life. I have also indicated that with any procedure there is always the possibility of an unexpected complication. All questions were answered and the Family expressed understanding of the treatment plan and consent to the treatment.  Our recommendations are outlined below.  Recommendations: IV Alteplase recommended.  Thrombolytic bolus given Without Complication.   IV Alteplase/Activase Total Dose - 75.5 mg IV Alteplase/Activase Bolus Dose - 7.6 mg IV Alteplase/Activase Infusion Dose - 68.0 mg   Routine post Thrombolytic monitoring including  neuro checks and blood pressure control during/after treatment Monitor blood pressure Check blood pressure and neuro assessment every 15 min for 2  h, then every 30 min for 6 h, and finally every hour for 16 h.  Manage Blood Pressure per post Thrombolytic protocol.        Admission to ICU       CT brain 24 hours post Thrombolytic       NPO until swallowing screen performed and passed       No antiplatelet agents or anticoagulants (including heparin for DVT prophylaxis) in first 24 hours       No Foley catheter, nasogastric tube, arterial catheter or central venous catheter for 24 hr, unless absolutely necessary       Telemetry       Bedside swallow evaluation       HOB less than 30 degrees       Euglycemia       Avoid hyperthermia, PRN acetaminophen       DVT prophylaxis       Inpatient Neurology Consultation       Stroke evaluation as per inpatient neurology recommendations  Discussed with ED physician    ------------------------------------------------------------------------------  History of Present Illness: Patient is a 69 year old Female.   69 year old female with history of recent Covid who presents to the emergency department with hypoxia and shortness of breath. On the emergency department, she had the abrupt onset of left gaze deviation, right hemiparesis, and aphasia. CT head showed no acute findings. CT angiogram showed a left M1 occlusion. She had no contraindications IV thrombolytics. Platelet count, blood pressure, glucose more than range. Spoke with daughter and confirmed no contraindications, she is agreeable to proceed with IV thrombolytics as well as a potential thrombectomy.      Examination: BP(131/67 ), Pulse(94), Blood Glucose(145) 1A: Level of Consciousness - Arouses to minor stimulation + 1 1B: Ask Month and Age - Aphasic + 2 1C: Blink Eyes & Squeeze Hands - Performs 0 Tasks + 2 2: Test Horizontal Extraocular Movements - Forced Gaze Palsy:  Cannot Be Overcome + 2 3: Test Visual Fields - Complete Hemianopia + 2 4: Test Facial Palsy (Use Grimace if Obtunded) - Normal symmetry + 0 5A: Test Left Arm Motor Drift - No Drift for 10 Seconds + 0 5B: Test Right Arm Motor Drift - No Movement + 4 6A: Test Left Leg Motor Drift - Some Effort Against Gravity + 2 6B: Test Right Leg Motor Drift - Some Effort Against Gravity + 2 7: Test Limb Ataxia (FNF/Heel-Shin) - Does Not Understand + 0 8: Test Sensation - Complete Loss: Cannot Sense Being Touched At All + 2 9: Test Language/Aphasia - Mute/Global Aphasia: No Usable Speech/Auditory Comprehension + 3 10: Test Dysarthria - Mute/Anarthric + 2 11: Test Extinction/Inattention - Profound hemi-inattention (ex: does not recognize own hand) + 2  NIHSS Score: 26  Pre-Morbid Modified Rankin Scale: 0 Points = No symptoms at all   Patient/Family was informed the Neurology Consult would occur via TeleHealth consult by way of interactive audio and video telecommunications and consented to receiving care in this manner.   Patient is being evaluated for possible acute neurologic impairment and high probability of imminent or life-threatening deterioration. I spent total of 45 minutes providing care to this patient, including time for face to face visit via telemedicine, review of medical records, imaging studies and discussion of findings with providers, the patient and/or family.   Dr Lacie Scotts   [facility] [phone]  Case [id number]

## 2020-03-10 ENCOUNTER — Inpatient Hospital Stay (HOSPITAL_COMMUNITY): Payer: Medicare Other

## 2020-03-10 ENCOUNTER — Inpatient Hospital Stay: Payer: Self-pay

## 2020-03-10 ENCOUNTER — Emergency Department (HOSPITAL_COMMUNITY): Payer: Medicare Other

## 2020-03-10 ENCOUNTER — Emergency Department (HOSPITAL_COMMUNITY): Payer: Medicare Other | Admitting: Anesthesiology

## 2020-03-10 DIAGNOSIS — E785 Hyperlipidemia, unspecified: Secondary | ICD-10-CM | POA: Diagnosis present

## 2020-03-10 DIAGNOSIS — E039 Hypothyroidism, unspecified: Secondary | ICD-10-CM | POA: Diagnosis present

## 2020-03-10 DIAGNOSIS — I82453 Acute embolism and thrombosis of peroneal vein, bilateral: Secondary | ICD-10-CM | POA: Diagnosis present

## 2020-03-10 DIAGNOSIS — J80 Acute respiratory distress syndrome: Secondary | ICD-10-CM

## 2020-03-10 DIAGNOSIS — K589 Irritable bowel syndrome without diarrhea: Secondary | ICD-10-CM | POA: Diagnosis present

## 2020-03-10 DIAGNOSIS — I639 Cerebral infarction, unspecified: Secondary | ICD-10-CM | POA: Diagnosis not present

## 2020-03-10 DIAGNOSIS — G8191 Hemiplegia, unspecified affecting right dominant side: Secondary | ICD-10-CM | POA: Diagnosis present

## 2020-03-10 DIAGNOSIS — Z4682 Encounter for fitting and adjustment of non-vascular catheter: Secondary | ICD-10-CM | POA: Diagnosis not present

## 2020-03-10 DIAGNOSIS — R4701 Aphasia: Secondary | ICD-10-CM | POA: Diagnosis present

## 2020-03-10 DIAGNOSIS — M1990 Unspecified osteoarthritis, unspecified site: Secondary | ICD-10-CM | POA: Diagnosis present

## 2020-03-10 DIAGNOSIS — R6521 Severe sepsis with septic shock: Secondary | ICD-10-CM

## 2020-03-10 DIAGNOSIS — D6869 Other thrombophilia: Secondary | ICD-10-CM | POA: Diagnosis present

## 2020-03-10 DIAGNOSIS — R918 Other nonspecific abnormal finding of lung field: Secondary | ICD-10-CM | POA: Diagnosis not present

## 2020-03-10 DIAGNOSIS — I63412 Cerebral infarction due to embolism of left middle cerebral artery: Secondary | ICD-10-CM

## 2020-03-10 DIAGNOSIS — A4189 Other specified sepsis: Secondary | ICD-10-CM | POA: Diagnosis present

## 2020-03-10 DIAGNOSIS — K219 Gastro-esophageal reflux disease without esophagitis: Secondary | ICD-10-CM | POA: Diagnosis present

## 2020-03-10 DIAGNOSIS — Z87891 Personal history of nicotine dependence: Secondary | ICD-10-CM | POA: Diagnosis not present

## 2020-03-10 DIAGNOSIS — A419 Sepsis, unspecified organism: Secondary | ICD-10-CM

## 2020-03-10 DIAGNOSIS — I82443 Acute embolism and thrombosis of tibial vein, bilateral: Secondary | ICD-10-CM | POA: Diagnosis present

## 2020-03-10 DIAGNOSIS — G936 Cerebral edema: Secondary | ICD-10-CM | POA: Diagnosis not present

## 2020-03-10 DIAGNOSIS — Z9911 Dependence on respirator [ventilator] status: Secondary | ICD-10-CM | POA: Diagnosis not present

## 2020-03-10 DIAGNOSIS — I63512 Cerebral infarction due to unspecified occlusion or stenosis of left middle cerebral artery: Secondary | ICD-10-CM | POA: Diagnosis not present

## 2020-03-10 DIAGNOSIS — J1282 Pneumonia due to coronavirus disease 2019: Secondary | ICD-10-CM | POA: Diagnosis present

## 2020-03-10 DIAGNOSIS — R414 Neurologic neglect syndrome: Secondary | ICD-10-CM | POA: Diagnosis present

## 2020-03-10 DIAGNOSIS — U071 COVID-19: Secondary | ICD-10-CM

## 2020-03-10 DIAGNOSIS — N2889 Other specified disorders of kidney and ureter: Secondary | ICD-10-CM | POA: Diagnosis not present

## 2020-03-10 DIAGNOSIS — E876 Hypokalemia: Secondary | ICD-10-CM | POA: Diagnosis present

## 2020-03-10 DIAGNOSIS — Z8249 Family history of ischemic heart disease and other diseases of the circulatory system: Secondary | ICD-10-CM | POA: Diagnosis not present

## 2020-03-10 DIAGNOSIS — Z66 Do not resuscitate: Secondary | ICD-10-CM | POA: Diagnosis not present

## 2020-03-10 DIAGNOSIS — Z515 Encounter for palliative care: Secondary | ICD-10-CM | POA: Diagnosis not present

## 2020-03-10 HISTORY — PX: IR PERCUTANEOUS ART THROMBECTOMY/INFUSION INTRACRANIAL INC DIAG ANGIO: IMG6087

## 2020-03-10 HISTORY — PX: IR US GUIDE VASC ACCESS RIGHT: IMG2390

## 2020-03-10 LAB — POCT I-STAT 7, (LYTES, BLD GAS, ICA,H+H)
Acid-base deficit: 6 mmol/L — ABNORMAL HIGH (ref 0.0–2.0)
Acid-base deficit: 9 mmol/L — ABNORMAL HIGH (ref 0.0–2.0)
Acid-base deficit: 5 mmol/L — ABNORMAL HIGH (ref 0.0–2.0)
Bicarbonate: 21 mmol/L (ref 20.0–28.0)
Bicarbonate: 17.6 mmol/L — ABNORMAL LOW (ref 20.0–28.0)
Bicarbonate: 22 mmol/L (ref 20.0–28.0)
Calcium, Ion: 1.11 mmol/L — ABNORMAL LOW (ref 1.15–1.40)
Calcium, Ion: 1.01 mmol/L — ABNORMAL LOW (ref 1.15–1.40)
Calcium, Ion: 1.13 mmol/L — ABNORMAL LOW (ref 1.15–1.40)
HCT: 33 % — ABNORMAL LOW (ref 36.0–46.0)
HCT: 27 % — ABNORMAL LOW (ref 36.0–46.0)
HCT: 36 % (ref 36.0–46.0)
Hemoglobin: 11.2 g/dL — ABNORMAL LOW (ref 12.0–15.0)
Hemoglobin: 9.2 g/dL — ABNORMAL LOW (ref 12.0–15.0)
Hemoglobin: 12.2 g/dL (ref 12.0–15.0)
O2 Saturation: 85 %
O2 Saturation: 89 %
O2 Saturation: 100 %
Patient temperature: 34.4
Patient temperature: 34.4
Patient temperature: 99.8
Potassium: 2.6 mmol/L — CL (ref 3.5–5.1)
Potassium: 2.4 mmol/L — CL (ref 3.5–5.1)
Potassium: 4.5 mmol/L (ref 3.5–5.1)
Sodium: 147 mmol/L — ABNORMAL HIGH (ref 135–145)
Sodium: 150 mmol/L — ABNORMAL HIGH (ref 135–145)
Sodium: 146 mmol/L — ABNORMAL HIGH (ref 135–145)
TCO2: 22 mmol/L (ref 22–32)
TCO2: 19 mmol/L — ABNORMAL LOW (ref 22–32)
TCO2: 23 mmol/L (ref 22–32)
pCO2 arterial: 39.7 mmHg (ref 32.0–48.0)
pCO2 arterial: 36.6 mmHg (ref 32.0–48.0)
pCO2 arterial: 50.5 mmHg — ABNORMAL HIGH (ref 32.0–48.0)
pH, Arterial: 7.318 — ABNORMAL LOW (ref 7.350–7.450)
pH, Arterial: 7.277 — ABNORMAL LOW (ref 7.350–7.450)
pH, Arterial: 7.25 — ABNORMAL LOW (ref 7.350–7.450)
pO2, Arterial: 47 mmHg — ABNORMAL LOW (ref 83.0–108.0)
pO2, Arterial: 56 mmHg — ABNORMAL LOW (ref 83.0–108.0)
pO2, Arterial: 296 mmHg — ABNORMAL HIGH (ref 83.0–108.0)

## 2020-03-10 LAB — URINALYSIS, ROUTINE W REFLEX MICROSCOPIC
Bacteria, UA: NONE SEEN
Bilirubin Urine: NEGATIVE
Glucose, UA: NEGATIVE mg/dL
Ketones, ur: NEGATIVE mg/dL
Leukocytes,Ua: NEGATIVE
Nitrite: NEGATIVE
Protein, ur: 30 mg/dL — AB
Specific Gravity, Urine: >1.046 — ABNORMAL HIGH (ref 1.005–1.030)
pH: 5 (ref 5.0–8.0)

## 2020-03-10 LAB — ABO/RH: ABO/RH(D): O NEG

## 2020-03-10 LAB — RAPID URINE DRUG SCREEN, HOSP PERFORMED
Amphetamines: NOT DETECTED
Barbiturates: NOT DETECTED
Benzodiazepines: NOT DETECTED
Cocaine: NOT DETECTED
Opiates: NOT DETECTED
Tetrahydrocannabinol: NOT DETECTED

## 2020-03-10 LAB — CBC
HCT: 37 % (ref 36.0–46.0)
Hemoglobin: 12.2 g/dL (ref 12.0–15.0)
MCH: 30.2 pg (ref 26.0–34.0)
MCHC: 33 g/dL (ref 30.0–36.0)
MCV: 91.6 fL (ref 80.0–100.0)
Platelets: 158 10*3/uL (ref 150–400)
RBC: 4.04 MIL/uL (ref 3.87–5.11)
RDW: 13.5 % (ref 11.5–15.5)
WBC: 5.7 10*3/uL (ref 4.0–10.5)
nRBC: 0 % (ref 0.0–0.2)

## 2020-03-10 LAB — HEMOGLOBIN A1C
Hgb A1c MFr Bld: 5.8 % — ABNORMAL HIGH (ref 4.8–5.6)
Mean Plasma Glucose: 119.76 mg/dL

## 2020-03-10 LAB — MAGNESIUM: Magnesium: 2 mg/dL (ref 1.7–2.4)

## 2020-03-10 LAB — LIPID PANEL
Cholesterol: 107 mg/dL (ref 0–200)
HDL: 23 mg/dL — ABNORMAL LOW (ref >40)
LDL Cholesterol: 61 mg/dL (ref 0–99)
Total CHOL/HDL Ratio: 4.7 RATIO
Triglycerides: 113 mg/dL (ref <150)
VLDL: 23 mg/dL (ref 0–40)

## 2020-03-10 LAB — COMPREHENSIVE METABOLIC PANEL
ALT: 29 U/L (ref 0–44)
AST: 43 U/L — ABNORMAL HIGH (ref 15–41)
Albumin: 1.9 g/dL — ABNORMAL LOW (ref 3.5–5.0)
Alkaline Phosphatase: 35 U/L — ABNORMAL LOW (ref 38–126)
Anion gap: 11 (ref 5–15)
BUN: 15 mg/dL (ref 8–23)
CO2: 20 mmol/L — ABNORMAL LOW (ref 22–32)
Calcium: 6.9 mg/dL — ABNORMAL LOW (ref 8.9–10.3)
Chloride: 115 mmol/L — ABNORMAL HIGH (ref 98–111)
Creatinine, Ser: 0.85 mg/dL (ref 0.44–1.00)
GFR calc Af Amer: >60 mL/min (ref >60)
GFR calc non Af Amer: >60 mL/min (ref >60)
Glucose, Bld: 140 mg/dL — ABNORMAL HIGH (ref 70–99)
Potassium: 2.9 mmol/L — ABNORMAL LOW (ref 3.5–5.1)
Sodium: 146 mmol/L — ABNORMAL HIGH (ref 135–145)
Total Bilirubin: 0.7 mg/dL (ref 0.3–1.2)
Total Protein: 4.8 g/dL — ABNORMAL LOW (ref 6.5–8.1)

## 2020-03-10 LAB — GLUCOSE, CAPILLARY
Glucose-Capillary: 133 mg/dL — ABNORMAL HIGH (ref 70–99)
Glucose-Capillary: 134 mg/dL — ABNORMAL HIGH (ref 70–99)
Glucose-Capillary: 140 mg/dL — ABNORMAL HIGH (ref 70–99)
Glucose-Capillary: 161 mg/dL — ABNORMAL HIGH (ref 70–99)
Glucose-Capillary: 164 mg/dL — ABNORMAL HIGH (ref 70–99)

## 2020-03-10 LAB — FIBRINOGEN: Fibrinogen: 390 mg/dL (ref 210–475)

## 2020-03-10 LAB — MRSA PCR SCREENING: MRSA by PCR: NEGATIVE

## 2020-03-10 LAB — PHOSPHORUS: Phosphorus: 3.1 mg/dL (ref 2.5–4.6)

## 2020-03-10 LAB — HIV ANTIBODY (ROUTINE TESTING W REFLEX): HIV Screen 4th Generation wRfx: NONREACTIVE

## 2020-03-10 LAB — D-DIMER, QUANTITATIVE: D-Dimer, Quant: >20 ug/mL-FEU — ABNORMAL HIGH (ref 0.00–0.50)

## 2020-03-10 LAB — BRAIN NATRIURETIC PEPTIDE: B Natriuretic Peptide: 125.3 pg/mL — ABNORMAL HIGH (ref 0.0–100.0)

## 2020-03-10 LAB — C-REACTIVE PROTEIN: CRP: 8.4 mg/dL — ABNORMAL HIGH (ref <1.0)

## 2020-03-10 LAB — FERRITIN: Ferritin: 950 ng/mL — ABNORMAL HIGH (ref 11–307)

## 2020-03-10 MED ORDER — PANTOPRAZOLE SODIUM 40 MG IV SOLR
40.0000 mg | Freq: Every day | INTRAVENOUS | Status: DC
  Administered 2020-03-10: 40 mg via INTRAVENOUS
  Filled 2020-03-10: qty 40

## 2020-03-10 MED ORDER — ADULT MULTIVITAMIN W/MINERALS CH
1.0000 | ORAL_TABLET | Freq: Every day | ORAL | Status: DC
  Administered 2020-03-10: 1
  Filled 2020-03-10 (×2): qty 1

## 2020-03-10 MED ORDER — STROKE: EARLY STAGES OF RECOVERY BOOK
Freq: Once | Status: DC
  Filled 2020-03-10: qty 1

## 2020-03-10 MED ORDER — BARICITINIB 2 MG PO TABS
4.0000 mg | ORAL_TABLET | Freq: Every day | ORAL | Status: DC
  Filled 2020-03-10: qty 2

## 2020-03-10 MED ORDER — LEVOTHYROXINE SODIUM 137 MCG PO TABS
137.0000 ug | ORAL_TABLET | Freq: Every day | ORAL | Status: DC
  Filled 2020-03-10 (×3): qty 1

## 2020-03-10 MED ORDER — FENTANYL CITRATE (PF) 100 MCG/2ML IJ SOLN
50.0000 ug | Freq: Once | INTRAMUSCULAR | Status: AC
  Administered 2020-03-10: 50 ug via INTRAVENOUS

## 2020-03-10 MED ORDER — SODIUM CHLORIDE 0.9 % IV BOLUS: 500.0000 mL | Freq: Once | INTRAVENOUS | Status: DC

## 2020-03-10 MED ORDER — ACETAMINOPHEN 325 MG PO TABS: 650.0000 mg | ORAL_TABLET | ORAL | Status: DC | PRN

## 2020-03-10 MED ORDER — SENNOSIDES-DOCUSATE SODIUM 8.6-50 MG PO TABS: 1.0000 | ORAL_TABLET | Freq: Every evening | ORAL | Status: DC | PRN

## 2020-03-10 MED ORDER — ORAL CARE MOUTH RINSE
15.0000 mL | OROMUCOSAL | Status: DC
  Administered 2020-03-10 – 2020-03-11 (×13): 15 mL via OROMUCOSAL

## 2020-03-10 MED ORDER — FENTANYL CITRATE (PF) 100 MCG/2ML IJ SOLN: 25.0000 ug | Freq: Once | INTRAMUSCULAR | Status: DC

## 2020-03-10 MED ORDER — CLEVIDIPINE BUTYRATE 0.5 MG/ML IV EMUL
0.0000 mg/h | INTRAVENOUS | Status: DC
  Administered 2020-03-10: 1 mg/h via INTRAVENOUS
  Filled 2020-03-10: qty 100

## 2020-03-10 MED ORDER — IOHEXOL 300 MG/ML  SOLN: 50.0000 mL | Freq: Once | INTRAMUSCULAR | Status: DC | PRN

## 2020-03-10 MED ORDER — SODIUM CHLORIDE 0.9 % IV SOLN: INTRAVENOUS | Status: DC | PRN

## 2020-03-10 MED ORDER — METHYLPREDNISOLONE SODIUM SUCC 125 MG IJ SOLR
0.5000 mg/kg | Freq: Two times a day (BID) | INTRAMUSCULAR | Status: DC
  Administered 2020-03-10 – 2020-03-11 (×3): 41.875 mg via INTRAVENOUS
  Filled 2020-03-10 (×3): qty 2

## 2020-03-10 MED ORDER — PHENYLEPHRINE 40 MCG/ML (10ML) SYRINGE FOR IV PUSH (FOR BLOOD PRESSURE SUPPORT)
PREFILLED_SYRINGE | INTRAVENOUS | Status: DC | PRN
  Administered 2020-03-10 (×2): 40 ug via INTRAVENOUS
  Administered 2020-03-10: 80 ug via INTRAVENOUS

## 2020-03-10 MED ORDER — POTASSIUM CHLORIDE 20 MEQ/15ML (10%) PO SOLN
40.0000 meq | Freq: Four times a day (QID) | ORAL | Status: AC
  Administered 2020-03-10 (×2): 40 meq
  Filled 2020-03-10 (×2): qty 30

## 2020-03-10 MED ORDER — PRAVASTATIN SODIUM 40 MG PO TABS: 40.0000 mg | ORAL_TABLET | Freq: Every day | ORAL | Status: DC

## 2020-03-10 MED ORDER — PRAVASTATIN SODIUM 40 MG PO TABS
40.0000 mg | ORAL_TABLET | Freq: Every day | ORAL | Status: DC
  Administered 2020-03-10: 40 mg
  Filled 2020-03-10: qty 1

## 2020-03-10 MED ORDER — NOREPINEPHRINE 4 MG/250ML-% IV SOLN
INTRAVENOUS | Status: AC
  Administered 2020-03-10: 2 ug/min via INTRAVENOUS
  Filled 2020-03-10: qty 250

## 2020-03-10 MED ORDER — SODIUM CHLORIDE 0.9 % IV SOLN: INTRAVENOUS | Status: DC

## 2020-03-10 MED ORDER — SODIUM CHLORIDE 0.9% FLUSH
10.0000 mL | Freq: Two times a day (BID) | INTRAVENOUS | Status: DC
  Administered 2020-03-10: 10 mL
  Administered 2020-03-10: 20 mL
  Administered 2020-03-11: 10 mL

## 2020-03-10 MED ORDER — SODIUM CHLORIDE 0.9 % IV SOLN
200.0000 mg | Freq: Once | INTRAVENOUS | Status: AC
  Administered 2020-03-10: 200 mg via INTRAVENOUS
  Filled 2020-03-10: qty 40

## 2020-03-10 MED ORDER — CHLORHEXIDINE GLUCONATE CLOTH 2 % EX PADS
6.0000 | MEDICATED_PAD | Freq: Every day | CUTANEOUS | Status: DC
  Administered 2020-03-10: 6 via TOPICAL

## 2020-03-10 MED ORDER — SODIUM CHLORIDE 0.9% FLUSH: 10.0000 mL | INTRAVENOUS | Status: DC | PRN

## 2020-03-10 MED ORDER — PHENYLEPHRINE HCL-NACL 10-0.9 MG/250ML-% IV SOLN: 0.0000 ug/min | INTRAVENOUS | Status: DC

## 2020-03-10 MED ORDER — SODIUM BICARBONATE 8.4 % IV SOLN
INTRAVENOUS | Status: AC
  Filled 2020-03-10: qty 50

## 2020-03-10 MED ORDER — MIDAZOLAM HCL 2 MG/2ML IJ SOLN: 2.0000 mg | INTRAMUSCULAR | Status: DC | PRN

## 2020-03-10 MED ORDER — CHLORHEXIDINE GLUCONATE 0.12% ORAL RINSE (MEDLINE KIT)
15.0000 mL | Freq: Two times a day (BID) | OROMUCOSAL | Status: DC
  Administered 2020-03-10 – 2020-03-11 (×3): 15 mL via OROMUCOSAL

## 2020-03-10 MED ORDER — POTASSIUM CHLORIDE 10 MEQ/100ML IV SOLN
10.0000 meq | INTRAVENOUS | Status: AC
  Administered 2020-03-10 (×4): 10 meq via INTRAVENOUS
  Filled 2020-03-10 (×4): qty 100

## 2020-03-10 MED ORDER — SODIUM CHLORIDE 0.9 % IV SOLN
100.0000 mg | Freq: Every day | INTRAVENOUS | Status: DC
  Administered 2020-03-11: 100 mg via INTRAVENOUS
  Filled 2020-03-10 (×2): qty 20

## 2020-03-10 MED ORDER — IOHEXOL 300 MG/ML  SOLN
150.0000 mL | Freq: Once | INTRAMUSCULAR | Status: AC | PRN
  Administered 2020-03-10: 95 mL via INTRA_ARTERIAL

## 2020-03-10 MED ORDER — BARICITINIB 2 MG PO TABS
4.0000 mg | ORAL_TABLET | Freq: Every day | ORAL | Status: DC
  Administered 2020-03-10: 4 mg via ORAL
  Filled 2020-03-10: qty 2

## 2020-03-10 MED ORDER — FENTANYL CITRATE (PF) 250 MCG/5ML IJ SOLN
INTRAMUSCULAR | Status: DC | PRN
Start: 2020-03-10 — End: 2020-03-10
  Administered 2020-03-10 (×4): 25 ug via INTRAVENOUS

## 2020-03-10 MED ORDER — ENOXAPARIN SODIUM 40 MG/0.4ML ~~LOC~~ SOLN
40.0000 mg | Freq: Every day | SUBCUTANEOUS | Status: DC
  Administered 2020-03-10: 40 mg via SUBCUTANEOUS
  Filled 2020-03-10: qty 0.4

## 2020-03-10 MED ORDER — VITAL HIGH PROTEIN PO LIQD: 1000.0000 mL | ORAL | Status: DC

## 2020-03-10 MED ORDER — ACETAMINOPHEN 650 MG RE SUPP: 650.0000 mg | RECTAL | Status: DC | PRN

## 2020-03-10 MED ORDER — PROPOFOL 500 MG/50ML IV EMUL
INTRAVENOUS | Status: DC | PRN
  Administered 2020-03-10: 25 ug/kg/min via INTRAVENOUS

## 2020-03-10 MED ORDER — ASPIRIN 325 MG PO TABS
325.0000 mg | ORAL_TABLET | Freq: Every day | ORAL | Status: DC
  Administered 2020-03-11: 325 mg
  Filled 2020-03-10: qty 1

## 2020-03-10 MED ORDER — FENTANYL BOLUS VIA INFUSION
25.0000 ug | INTRAVENOUS | Status: DC | PRN
  Filled 2020-03-10: qty 25

## 2020-03-10 MED ORDER — SODIUM BICARBONATE 8.4 % IV SOLN
INTRAVENOUS | Status: DC | PRN
  Administered 2020-03-10: 50 meq via INTRAVENOUS

## 2020-03-10 MED ORDER — PHENYLEPHRINE HCL-NACL 10-0.9 MG/250ML-% IV SOLN
25.0000 ug/min | INTRAVENOUS | Status: DC
  Administered 2020-03-10: 25 ug/min via INTRAVENOUS
  Filled 2020-03-10: qty 250

## 2020-03-10 MED ORDER — PROSOURCE TF PO LIQD
45.0000 mL | Freq: Every day | ORAL | Status: DC
  Filled 2020-03-10: qty 45

## 2020-03-10 MED ORDER — PHENYLEPHRINE HCL-NACL 10-0.9 MG/250ML-% IV SOLN
INTRAVENOUS | Status: AC
  Filled 2020-03-10: qty 250

## 2020-03-10 MED ORDER — DOCUSATE SODIUM 50 MG/5ML PO LIQD
100.0000 mg | Freq: Two times a day (BID) | ORAL | Status: DC
  Administered 2020-03-10: 100 mg
  Filled 2020-03-10: qty 10

## 2020-03-10 MED ORDER — IOHEXOL 300 MG/ML  SOLN
150.0000 mL | Freq: Once | INTRAMUSCULAR | Status: AC | PRN
  Administered 2020-03-10: 30 mL via INTRA_ARTERIAL

## 2020-03-10 MED ORDER — PROSOURCE TF PO LIQD: 45.0000 mL | Freq: Two times a day (BID) | ORAL | Status: DC

## 2020-03-10 MED ORDER — DEXMEDETOMIDINE HCL IN NACL 400 MCG/100ML IV SOLN
0.0000 ug/kg/h | INTRAVENOUS | Status: DC
  Administered 2020-03-10 (×3): 1.2 ug/kg/h via INTRAVENOUS
  Administered 2020-03-10: 0.4 ug/kg/h via INTRAVENOUS
  Administered 2020-03-11 (×3): 1 ug/kg/h via INTRAVENOUS
  Administered 2020-03-11: 1.2 ug/kg/h via INTRAVENOUS
  Filled 2020-03-10 (×8): qty 100

## 2020-03-10 MED ORDER — NOREPINEPHRINE 4 MG/250ML-% IV SOLN
0.0000 ug/min | INTRAVENOUS | Status: DC
  Administered 2020-03-11: 3.5 ug/min via INTRAVENOUS
  Administered 2020-03-11: 3.493 ug/min via INTRAVENOUS
  Administered 2020-03-11: 9 ug/min via INTRAVENOUS
  Filled 2020-03-10 (×2): qty 250

## 2020-03-10 MED ORDER — ROCURONIUM BROMIDE 10 MG/ML (PF) SYRINGE
PREFILLED_SYRINGE | INTRAVENOUS | Status: DC | PRN
  Administered 2020-03-10: 80 mg via INTRAVENOUS
  Administered 2020-03-10: 50 mg via INTRAVENOUS
  Administered 2020-03-10: 20 mg via INTRAVENOUS

## 2020-03-10 MED ORDER — LEVOTHYROXINE SODIUM 137 MCG PO TABS
137.0000 ug | ORAL_TABLET | Freq: Every day | ORAL | Status: DC
  Administered 2020-03-11: 137 ug
  Filled 2020-03-10: qty 1

## 2020-03-10 MED ORDER — ACETAMINOPHEN 160 MG/5ML PO SOLN: 650.0000 mg | ORAL | Status: DC | PRN

## 2020-03-10 MED ORDER — MAGNESIUM SULFATE 2 GM/50ML IV SOLN
2.0000 g | Freq: Once | INTRAVENOUS | Status: AC
  Administered 2020-03-10: 2 g via INTRAVENOUS
  Filled 2020-03-10: qty 50

## 2020-03-10 MED ORDER — FENTANYL CITRATE (PF) 100 MCG/2ML IJ SOLN
25.0000 ug | Freq: Once | INTRAMUSCULAR | Status: DC
Start: 2020-03-10 — End: 2020-03-10

## 2020-03-10 MED ORDER — SODIUM CHLORIDE 0.9 % IV SOLN: 250.0000 mL | INTRAVENOUS | Status: DC

## 2020-03-10 MED ORDER — ASPIRIN 325 MG PO TABS
325.0000 mg | ORAL_TABLET | Freq: Every day | ORAL | Status: DC
  Filled 2020-03-10: qty 1

## 2020-03-10 MED ORDER — FENTANYL 2500MCG IN NS 250ML (10MCG/ML) PREMIX INFUSION
25.0000 ug/h | INTRAVENOUS | Status: DC
  Administered 2020-03-10: 200 ug/h via INTRAVENOUS
  Administered 2020-03-10: 50 ug/h via INTRAVENOUS
  Administered 2020-03-11: 200 ug/h via INTRAVENOUS
  Filled 2020-03-10 (×3): qty 250

## 2020-03-10 MED ORDER — POTASSIUM CHLORIDE 20 MEQ/15ML (10%) PO SOLN
40.0000 meq | Freq: Four times a day (QID) | ORAL | Status: DC
  Administered 2020-03-10: 40 meq via ORAL
  Filled 2020-03-10: qty 30

## 2020-03-10 MED ORDER — INSULIN ASPART 100 UNIT/ML ~~LOC~~ SOLN
0.0000 [IU] | SUBCUTANEOUS | Status: DC
  Administered 2020-03-10: 2 [IU] via SUBCUTANEOUS
  Administered 2020-03-10 (×2): 3 [IU] via SUBCUTANEOUS
  Administered 2020-03-10 – 2020-03-11 (×3): 2 [IU] via SUBCUTANEOUS
  Administered 2020-03-11: 3 [IU] via SUBCUTANEOUS

## 2020-03-10 MED ORDER — PHENYLEPHRINE HCL-NACL 10-0.9 MG/250ML-% IV SOLN
INTRAVENOUS | Status: DC | PRN
  Administered 2020-03-10: 15 ug/min via INTRAVENOUS

## 2020-03-10 MED ORDER — POLYETHYLENE GLYCOL 3350 17 G PO PACK: 17.0000 g | PACK | Freq: Every day | ORAL | Status: DC

## 2020-03-10 NOTE — ED Notes (Signed)
[facility] at bedside to transport pt 

## 2020-03-10 NOTE — Progress Notes (Signed)
Peripherally Inserted Central Catheter Placement  The IV Nurse has discussed with the patient and/or persons authorized to consent for the patient, the purpose of this procedure and the potential benefits and risks involved with this procedure.  The benefits include less needle sticks, lab draws from the catheter, and the patient may be discharged home with the catheter. Risks include, but not limited to, infection, bleeding, blood clot (thrombus formation), and puncture of an artery; nerve damage and irregular heartbeat and possibility to perform a PICC exchange if needed/ordered by physician.  Alternatives to this procedure were also discussed.  Bard Power PICC patient education guide, fact sheet on infection prevention and patient information card has been provided to patient /or left at bedside. Consent given by daughter, Brigitte Pulse via telephone.  PICC Placement Documentation  PICC Triple Lumen 03/10/20 PICC Right Basilic 40 cm 0 cm (Active)  Indication for Insertion or Continuance of Line Vasoactive infusions 03/10/20 1100  Exposed Catheter (cm) 0 cm 03/10/20 1100  Site Assessment Clean;Dry;Intact 03/10/20 1100  Lumen #1 Status Flushed;Blood return noted;Saline locked 03/10/20 1100  Lumen #2 Status Flushed;Blood return noted;Saline locked 03/10/20 1100  Lumen #3 Status Flushed;Blood return noted;Saline locked 03/10/20 1100  Dressing Type Transparent 03/10/20 1100  Dressing Status Clean;Dry;Intact;Antimicrobial disc in place 03/10/20 1100  Safety Lock Not Applicable 03/10/20 1100  Line Care Connections checked and tightened 03/10/20 1100  Line Adjustment (NICU/IV Team Only) No 03/10/20 1100  Dressing Intervention New dressing 03/10/20 1100  Dressing Change Due 03/17/20 03/10/20 1100       Arman Loy III, Arnetha Massy 03/10/2020, 11:55 AM

## 2020-03-10 NOTE — Sedation Documentation (Signed)
Called [facility] ED to enquire about pt status. Spoke with ED/[facility] staff who told be pt needed to be stabilized and given fluids prior to being transported to [facility] IR for thrombectomy.

## 2020-03-10 NOTE — ED Notes (Signed)
Upon arrival from EMS pt was alert and oriented x 4. Pt was hypoxic at 55% on 6L [place]. While triaging the pt she was answering questions appropriately. Around 2120 I asked pt a question regarding triage and she did not respond. I turned around to find the pt with a left sided gaze and tossing and turning in the bed. Pt was last normal at 2120 03/10/2020. MD Zammit notified.

## 2020-03-10 NOTE — Progress Notes (Signed)
SLP Cancellation Note  Patient Details Name: KILAH DRAHOS MRN: [id number] DOB: Nov 10, 1950   Cancelled treatment:       Reason Eval/Treat Not Completed: Medical issues which prohibited therapy (Intubated. Will f/u next date.)  Ferdinand Lango MA, CCC-SLP    Breckin Zafar Meryl 03/10/2020, 8:50 AM

## 2020-03-10 NOTE — Procedures (Signed)
Vascular technologist reports patient about to be proned. Will attempt echo later.

## 2020-03-10 NOTE — ED Provider Notes (Signed)
Currently care, they gave patient fentanyl and she is now hypotensive into the 70s.  Order was given for a 500 cc bolus of normal saline and if that did not improve her blood pressure to start Neo-Synephrine drip.   Devoria Albe, MD 03/10/20 [phone]

## 2020-03-10 NOTE — Anesthesia Procedure Notes (Signed)
Arterial Line Insertion Start/End8/21/2021 1:56 AM, 03/10/2020 2:05 AM Performed by: Jairo Ben, MD, Zollie Scale, CRNA, CRNA  Patient location: OOR procedure area. Preanesthetic checklist: patient identified, IV checked, surgical consent, monitors and equipment checked, pre-op evaluation, timeout performed and anesthesia consent Left, radial was placed Catheter size: 20 G Hand hygiene performed  and Seldinger technique used Allen's test indicative of satisfactory collateral circulation Attempts: 3 (CRNA, then MD) Procedure performed without using ultrasound guided technique. Following insertion, dressing applied and Biopatch. Patient tolerated the procedure well with no immediate complications.

## 2020-03-10 NOTE — Progress Notes (Addendum)
PT Cancellation Note  Patient Details Name: Deanna Larsen MRN: [id number] DOB: 09-Apr-1951   Cancelled Treatment:    Reason Eval/Treat Not Completed: Active bedrest order   1012 addendum: Spoke with OT regarding her conversation with MD. MD advised sign off as pt is medically unstable. Please re-order if med status improves.   Ilda Foil 03/10/2020, 8:24 AM   Aida Raider, PT  Office # [phone] Pager [phone]

## 2020-03-10 NOTE — Progress Notes (Signed)
Interventional Radiology Note   Patient transfer was initiated for emergent thrombectomy.    Patient arrival was delayed by need for medical support at [facility] hospital ED.   When she arrived we elected to perform non-contrast CT to evaluate for ASPECTS decay.   There had been clear progression, with ASPECTS determined to be 6 on the follow up .   Given the baseline:   Baseline mRS: 0 NIHSS:   26  CT ASPECTS: 10 CTA:   Left MCA M1 occlusion CTP:   Core infarct: 7cc, Estimated at risk territory: ~120cc   I discussed the new CT with Dr. Derry Lory of Stroke Neurology.  As the ASPECTS was 6, and there was such large penumbra, we agree it is reasonable to proceed.  For attempt for mechanical thrombectomy.     Signed,  Yvone Neu. Loreta Ave, DO

## 2020-03-10 NOTE — Progress Notes (Signed)
Patient proned at this time. All protective skin barriers placed. Pillow support for positioning.

## 2020-03-10 NOTE — H&P (Addendum)
NAME:  Deanna Larsen, MRN:  [id number], DOB:  August 04, 1950, LOS: 0 ADMISSION DATE:  May 22, 2020, CONSULTATION DATE:  03/10/20 REFERRING MD:  Neurology, CHIEF COMPLAINT:   Code stroke  Brief History   69 [facility] [place] [facility] [place] [facility] with past medical history of hypertension, hyperlipidemia, hypothyroidism who presented to the ER with worsening shortness of breath in the setting of known Covid-19 diagnosis.  While being triaged she developed focal neurologic symptoms and was diagnosed with left MCA LVO.  She was taken to IR for mechanical thrombectomy PCCM consulted to manage ventilator.  History of present illness   69 year old female with past medical history of hypertension, hyperlipidemia, GERD, hypothyroidism who tested positive for Covid-19 on 8/16, she was not a candidate for MAB.  She returned to the ER on 8/20 with hypoxia to 55% on 6 L, while being triaged patient suddenly developed left-sided gaze deviation, aphasia and right hemiparesis and code stroke was called, CT head was unremarkable, she received TPA and CTA showed left M1 LVO.  She was intubated and taken to the OR for mechanical thrombectomy and left intubated and transferred to intensive care.  Past Medical History   has a past medical history of Acid reflux, Arthritis, Hyperlipidemia, Hypertension, Hypothyroid, and IBS (irritable bowel syndrome).   Significant Hospital Events   8/20 mechanical thrombectomy and admit  Consults:  Neurology  Procedures:  8/20 ETT 8/20 mechanical thrombectomy  Significant Diagnostic Tests:  8/20 CTA>>Acute large vessel occlusion of the left M1 segment with poor collaterals. 7 mL core infarction in the insula. 150 cc penumbra throughout the remainder of the MCA territory. 8/21 CT head>>Worsening cytotoxic edema pattern with loss of gray-white interface within the left temporal lobe and left insula 8/20 CXR>>Moderate severity diffuse interstitial infiltrates  increased in severity when compared to the prior study.3. Mild stable multifocal infiltrates.  Micro Data:  8/21 MRSA>> 8/21 blood cultures x2>>  Antimicrobials:  8/21 ceftriaxone 8/29 remdesivir  Interim history/subjective:  Transported to intensive care in stable condition  Objective   Blood pressure (!) 112/46, pulse 83, resp. rate 20, height 5\' 5"  (1.651 m), weight 83.9 kg, SpO2 97 %.    Vent Mode: PRVC FiO2 (%):  [100 %] 100 % Set Rate:  [20 bmp-28 bmp] 20 bmp Vt Set:  [450 mL-550 mL] 450 mL PEEP:  [10 cmH20-12 cmH20] 12 cmH20 Plateau Pressure:  [22 cmH20] 22 cmH20   Intake/Output Summary (Last 24 hours) at 03/10/2020 [id number] Last data filed at 03/10/2020 0354 Gross per 24 hour  Intake 1000 ml  Output 15 ml  Net 985 ml   Filed Weights   Apr 08, 2020 2134  Weight: 83.9 kg    General: Elderly female intubated and sedated  HEENT: MM pink/moist, ETT in place Neuro: Pupils 2 mm equal and reactive, not withdrawing to pain, not following commands, breathing over the vent CV: s1s2 RRR, no m/r/g PULM: Decreased air movement in the bases GI: soft, bsx4 active  Extremities: warm/dry, 1+ edema  Skin: no rashes or lesions  Resolved Hospital Problem list     Assessment & Plan:   Acute left MCA CVA, large vessel occlusion in the setting of Covid-19 S/p TPA and mechanical thrombectomy, appreciate neurology recommendations -Frequent neuro checks, TTE pending -Cleviprex prn for BP control -left intubated post-procedure --Maintain full vent support with SAT/SBT as tolerated -titrate Vent setting to maintain SpO2 greater than or equal to 90%. -HOB elevated 30 degrees. -Plateau pressures less than 30 cm H20.  -Follow  chest x-ray, ABG prn.   -Bronchial hygiene and RT/bronchodilator protocol.  Hypoxic Respiratory Failure secondary to Covid-19 PNA -start Remdesevir, Steroids 1mg /kg -check inflammatory markers -Echo pending   Hypokalemia -received K during procedure by report,  will order additional 59meq's and Mag  Best practice:  Diet: NPO Pain/Anxiety/Delirium protocol (if indicated): propofol, fentanyl VAP protocol (if indicated): HOB 30degrees, suction prn DVT prophylaxis: SCD's GI prophylaxis: protonix Glucose control: SSI Mobility: bed rest Code Status: full code Family Communication:  Disposition: ICU  Labs   CBC: Recent Labs  Lab 03/05/20 0937 03/08/2020 2126 03/10/20 0226 03/10/20 0331  WBC 3.6* 4.9  --   --   NEUTROABS 2.4 3.8  --   --   HGB 15.7* 14.5 11.2* 9.2*  HCT 47.6* 43.2 33.0* 27.0*  MCV 91.9 90.8  --   --   PLT 125* 198  --   --     Basic Metabolic Panel: Recent Labs  Lab 03/05/20 0937 02/19/2020 2126 03/10/20 0226 03/10/20 0331  NA 137 144 147* 150*  K 3.6 3.0* 2.6* 2.4*  CL 105 110  --   --   CO2 21* 23  --   --   GLUCOSE 122* 145*  --   --   BUN 34* 24*  --   --   CREATININE 1.41* 1.15*  --   --   CALCIUM 8.2* 8.1*  --   --    GFR: Estimated Creatinine Clearance: 50.1 mL/min (A) (by C-G formula based on SCr of 1.15 mg/dL (H)). Recent Labs  Lab 03/05/20 0937 03/01/2020 2126  PROCALCITON  --  0.11  WBC 3.6* 4.9  LATICACIDVEN  --  2.8*    Liver Function Tests: Recent Labs  Lab 03/05/20 0937 02/19/2020 2126  AST 64* 51*  ALT 32 34  ALKPHOS 45 42  BILITOT 0.5 1.0  PROT 7.3 6.5  ALBUMIN 3.8 2.9*   No results for input(s): LIPASE, AMYLASE in the last 168 hours. No results for input(s): AMMONIA in the last 168 hours.  ABG    Component Value Date/Time   PHART 7.277 (L) 03/10/2020 0331   PCO2ART 36.6 03/10/2020 0331   PO2ART 56 (L) 03/10/2020 0331   HCO3 17.6 (L) 03/10/2020 0331   TCO2 19 (L) 03/10/2020 0331   ACIDBASEDEF 9.0 (H) 03/10/2020 0331   O2SAT 89.0 03/10/2020 0331     Coagulation Profile: Recent Labs  Lab 03/14/2020 2126  INR 1.1    Cardiac Enzymes: No results for input(s): CKTOTAL, CKMB, CKMBINDEX, TROPONINI in the last 168 hours.  HbA1C: Hgb A1c MFr Bld  Date/Time Value Ref Range  Status  03/10/2020 05:00 AM 5.8 (H) 4.8 - 5.6 % Final    Comment:    (NOTE) Pre diabetes:          5.7%-6.4%  Diabetes:              >6.4%  Glycemic control for   <7.0% adults with diabetes     CBG: No results for input(s): GLUCAP in the last 168 hours.  Review of Systems:   Unable to obtain secondary to mental status  Past Medical History  She,  has a past medical history of Acid reflux, Arthritis, Hyperlipidemia, Hypertension, Hypothyroid, and IBS (irritable bowel syndrome).   Surgical History    Past Surgical History:  Procedure Laterality Date  . ABDOMINAL HYSTERECTOMY    . BILATERAL OOPHORECTOMY    . BIOPSY  03/25/2019   Procedure: BIOPSY;  Surgeon: 05/25/2019, MD;  Location: [facility];  Service: Endoscopy;;  gastric  . CHOLECYSTECTOMY    . COLONOSCOPY    . COLONOSCOPY N/A 10/16/2016   redundant rectosigmoid colon, multiple diverticula in rectosigmoid and sigmoid, internal hemorrhoids, repeat in 2028.   Marland Kitchen ESOPHAGOGASTRODUODENOSCOPY N/A 03/25/2019   Procedure: ESOPHAGOGASTRODUODENOSCOPY (EGD);  Surgeon: West Bali, MD;  Location: [facility];  Service: Endoscopy;  Laterality: N/A;  2:15PM  . HIP SURGERY Right   . JOINT REPLACEMENT    . SHOULDER SURGERY       Social History   reports that she quit smoking about 14 years ago. She has a 30.00 pack-year smoking history. She has never used smokeless tobacco. She reports that she does not drink alcohol and does not use drugs.   Family History   Her family history includes Diabetes in her brother; Heart disease in her father. There is no history of Colon cancer or Colon polyps.   Allergies Allergies  Allergen Reactions  . Augmentin [Amoxicillin-Pot Clavulanate] Nausea And Vomiting    Did it involve swelling of the face/tongue/throat, SOB, or low BP? No Did it involve sudden or severe rash/hives, skin peeling, or any reaction on the inside of your mouth or nose? No Did you need to seek medical attention at  a hospital or doctor's office? No When did it last happen?5 years If all above answers are "NO", may proceed with cephalosporin use.       Home Medications  Prior to Admission medications   Medication Sig Start Date End Date Taking? Authorizing Provider  acetaminophen (TYLENOL) 500 MG tablet Take 500 mg by mouth every 6 (six) hours as needed for moderate pain or headache.    [provider]  benazepril (LOTENSIN) 20 MG tablet TAKE 1 AND 1/2 TABLETS(30 MG) BY MOUTH DAILY 05/12/19   Merlyn Albert, MD  calcium carbonate (TUMS - DOSED IN MG ELEMENTAL CALCIUM) 500 MG chewable tablet Chew 2 tablets by mouth 2 (two) times daily as needed for indigestion or heartburn.     [provider]  cephALEXin (KEFLEX) 500 MG capsule Take 1 capsule (500 mg total) by mouth 2 (two) times daily for 7 days. 03/05/20 02/23/2020  Burgess Amor, PA-C  levothyroxine (SYNTHROID) 137 MCG tablet Take 1 tablet (137 mcg total) by mouth daily. 05/12/19   Merlyn Albert, MD  pantoprazole (PROTONIX) 40 MG tablet 1 po 30 mins prior to meals bid 05/12/19   Merlyn Albert, MD  pravastatin (PRAVACHOL) 40 MG tablet Take 1 tablet (40 mg total) by mouth daily. 05/12/19   Merlyn Albert, MD     Critical care time: 50 minutes       CRITICAL CARE Performed by: Darcella Gasman Gleason   Total critical care time: 50 minutes  Critical care time was exclusive of separately billable procedures and treating other patients.  Critical care was necessary to treat or prevent imminent or life-threatening deterioration.  Critical care was time spent personally by me on the following activities: development of treatment plan with patient and/or surrogate as well as nursing, discussions with consultants, evaluation of patient's response to treatment, examination of patient, obtaining history from patient or surrogate, ordering and performing treatments and interventions, ordering and review of laboratory studies,  ordering and review of radiographic studies, pulse oximetry and re-evaluation of patient's condition.  Darcella Gasman Gleason, PA-C     PCCM Attending:    69 yo FM, admitted for Left M1 MCA Occlusion s/p TPA and thrombectomy, decompensated ARDS,  AHRF on MV secondary to COVID 19 PNA.   BP (!) 148/75   Pulse 92   Temp 99.8 F (37.7 C) (Axillary)   Resp (!) 35   Ht 5\' 5"  (1.651 m)   Wt 83.9 kg   SpO2 98%   BMI 30.78 kg/m  Gen: Elderly female, intubated on mechanical life support HEENT: NCAT, not tracking Heart: Regular rate rhythm S1-S2 Lungs: Bilateral mechanically ventilated breath sounds Abdomen: Soft nontender, nondistended  Labs: Reviewed CRP 8, D-dimer greater than 3 Arterial blood gas with severe hypoxemia PaO2 of 58.  Chest x-ray: Bilateral infiltrates consistent with worsening ARDS.  Assessment: ARDS secondary to COVID-19 pneumonia Acute left MCA stroke, hypercoagulable state secondary to COVID-19, status post full dose TPA and thrombectomy. Sedation needs while on mechanical support  Plan: Continue mechanical ventilation per ARDS protocol Target TVol 6-8cc/kgIBW Target Plateau Pressure < 30cm H20 Target driving pressure less than 15 cm of water Target PaO2 55-65: titrate PEEP/FiO2 per protocol As long as PaO2 to FiO2 ratio is less than 1:150 position in prone position for 16 hours a day Check CVP daily if CVL in place Target CVP less than 4, diurese as necessary Ventilator associated pneumonia prevention protocol Start baricitinib  Continue methylprednisolone Start DVT prophylaxis Repeat stat labs to follow-up hemoglobin after receiving IV TPA and hemoglobin drop. Stop Cleviprex  I would like to start full dose AC heparin possibly 24hrs after TPA if neurology felt it was safe based on the size of cerebral infarct.   Case discussed with neurology.  This patient is critically ill with multiple organ system failure; which, requires frequent high complexity  decision making, assessment, support, evaluation, and titration of therapies. This was completed through the application of advanced monitoring technologies and extensive interpretation of multiple databases. During this encounter critical care time was devoted to patient care services described in this note for 75 minutes.  , DO  Pulmonary Critical Care 03/10/2020 9:33 AM

## 2020-03-10 NOTE — Progress Notes (Signed)
Patient's head was turned to the left with the help RN at 17:00. ETT secured at 22 at the lip.

## 2020-03-10 NOTE — Transfer of Care (Signed)
Immediate Anesthesia Transfer of Care Note  Patient: Deanna Larsen  Procedure(s) Performed: IR WITH ANESTHESIA (N/A )  Patient Location: ICU  Anesthesia Type:General  Level of Consciousness: Patient remains intubated per anesthesia plan  Airway & Oxygen Therapy: Patient remains intubated per anesthesia plan and Patient placed on Ventilator (see vital sign flow sheet for setting)  Post-op Assessment: Report given to RN and Post -op Vital signs reviewed and stable  Post vital signs: Reviewed and stable  Last Vitals:  Vitals Value Taken Time  BP 157/64 (ABP) 03/10/20 0405  Temp    Pulse 84 03/10/20 0414  Resp 20 03/10/20 0414  SpO2 96 % 03/10/20 0414  Vitals shown include unvalidated device data.  Last Pain: There were no vitals filed for this visit.    Report to [facility], RT present at bedside, maintained on same ventilator settings, SBP maintained within baseline per [facility] MD orders of 140-160 mmHg, VSS, patient maintained on propofol and neo gtts.    Complications: No complications documented.

## 2020-03-10 NOTE — Progress Notes (Signed)
STROKE TEAM PROGRESS NOTE   INTERVAL HISTORY Pt still intubated on vent. Not following commands. Eyes closed, mild agitation with voice and pain stimulation but not able to open eyes and not following commands. CCM on board, pt O2 sat dropped and plan to start prone position. Not stable for any neuro imaging at this time. Will postpone MRI or CT for now.   OBJECTIVE Vitals:   03/10/20 0405 03/10/20 0500 03/10/20 0600 03/10/20 0700  BP:  (!) 112/46 (!) 122/51 (!) 123/55  Pulse:  83 79 73  Resp:  20 20 20   Temp:  99.8 F (37.7 C)    TempSrc:  Axillary    SpO2: 96% 97% 99% 100%  Weight:      Height:        CBC:  Recent Labs  Lab 03/05/20 0937 03/05/20 0937 02/21/2020 2126 02/19/2020 2126 03/10/20 0226 03/10/20 0331  WBC 3.6*  --  4.9  --   --   --   NEUTROABS 2.4  --  3.8  --   --   --   HGB 15.7*   < > 14.5   < > 11.2* 9.2*  HCT 47.6*   < > 43.2   < > 33.0* 27.0*  MCV 91.9  --  90.8  --   --   --   PLT 125*  --  198  --   --   --    < > = values in this interval not displayed.    Basic Metabolic Panel:  Recent Labs  Lab 03/05/20 0937 03/05/20 0937 03/08/2020 2126 02/24/2020 2126 03/10/20 0226 03/10/20 0331  NA 137   < > 144   < > 147* 150*  K 3.6   < > 3.0*   < > 2.6* 2.4*  CL 105  --  110  --   --   --   CO2 21*  --  23  --   --   --   GLUCOSE 122*  --  145*  --   --   --   BUN 34*  --  24*  --   --   --   CREATININE 1.41*  --  1.15*  --   --   --   CALCIUM 8.2*  --  8.1*  --   --   --    < > = values in this interval not displayed.    Lipid Panel:     Component Value Date/Time   CHOL 107 03/10/2020 0500   CHOL 161 05/13/2019 0917   TRIG 113 03/10/2020 0500   HDL 23 (L) 03/10/2020 0500   HDL 44 05/13/2019 0917   CHOLHDL 4.7 03/10/2020 0500   VLDL 23 03/10/2020 0500   LDLCALC 61 03/10/2020 0500   LDLCALC 97 05/13/2019 0917   HgbA1c:  Lab Results  Component Value Date   HGBA1C 5.8 (H) 03/10/2020   Urine Drug Screen: No results found for: LABOPIA,  COCAINSCRNUR, LABBENZ, AMPHETMU, THCU, LABBARB  Alcohol Level     Component Value Date/Time   ETH <10 02/27/2020 2126    IMAGING  MRI / MRA - pending  CT Head WO Contrast - pending  CT HEAD CODE STROKE WO CONTRAST 03/08/2020 IMPRESSION:  1. Mild age related volume loss.  Otherwise normal head CT.  2. ASPECTS is 10   CT Angio Head W or Wo Contrast CT Angio Neck W and/or Wo Contrast CT CEREBRAL PERFUSION W CONTRAST 02/27/2020 IMPRESSION:  Acute large vessel occlusion  of the left M1 segment with poor collaterals.  No significant carotid bifurcation stenosis.  CT HEAD WO CONTRAST 03/10/2020  IMPRESSION:  1. Worsening cytotoxic edema pattern with loss of gray-white interface within the left temporal lobe and left insula. ASPECTS is now 6.  2. No hemorrhage or mass effect.   DG Chest Port 1 View 02/29/2020 IMPRESSION:  1. Endotracheal tube with its distal tip extending into the right mainstem bronchus. Withdrawal of the ET tube by approximately 3.0 cm is recommended.  2. Moderate severity diffuse interstitial infiltrates increased in severity when compared to the prior study.  3. Mild stable multifocal infiltrates.   DG Chest Port 1 View 03/19/2020 IMPRESSION:  Worsening interstitial and ground-glass opacities, which may reflect multifocal pneumonia or edema.   Neuro Interventional Radiology - Cerebral Angiogram with Intervention 03/19/2020 - 3:40 AM FMD of left cervical ICA, recommend initiation of ASA as soon as possible Left M1 occlusion Final is TICI 2b Flat panel CT shows staining of left MCA territory, compatible with early infarction      Transthoracic Echocardiogram  Pending  Bilateral LE Venous Dopplers -   RIGHT:  - Findings consistent with acute deep vein thrombosis involving the right  posterior tibial veins, and right peroneal veins.  LEFT:  - Findings consistent with acute deep vein thrombosis involving the left  posterior tibial veins, and left peroneal  veins.  ECG - SR rate 83 BPM. Borderline prolonged QT interval (See cardiology reading for complete details)  PHYSICAL EXAM  Temp:  [99.8 F (37.7 C)] 99.8 F (37.7 C) (08/21 0500) Pulse Rate:  [73-115] 92 (08/21 0829) Resp:  [17-35] 35 (08/21 0829) BP: (85-182)/(46-128) 148/75 (08/21 0829) SpO2:  [80 %-100 %] 98 % (08/21 0829) Arterial Line BP: (144-146)/(61-67) 146/67 (08/21 0700) FiO2 (%):  [100 %] 100 % (08/21 0829) Weight:  [83.9 kg] 83.9 kg (08/20 2134)  General - Well nourished, well developed, intubated on sedation.  Ophthalmologic - fundi not visualized due to noncooperation.  Cardiovascular - Regular rate and rhythm.  Neuro - intubated on propofol, eyes closed, not following commands. With forced eye opening, left eye in primary position but right eye downward position, blinking to visual threat, doll's eyes sluggish, not tracking, pupils equal sluggish to light. Corneal reflex present on the left but absent on the right, gag and cough present. Breathing over the vent.  Facial symmetry not able to test due to ET tube.  Tongue protrusion not cooperative. Spontaneous movement of LUE and LLE about 2/5. On pain stimulation, LUE slight against gravity, LLE 2/5, RUE flaccid, RLE mild withdraw. DTR 1+ and positive right babinski. Sensation, coordination and gait not tested.   ASSESSMENT/PLAN [name] is a 69 y.o. female with history of HTN, HLD, Hypothyroid, GERD, IBS who initially was seen at [facility] for hypoxia 2/2 COVID Pneumonia. Developed acute onset L gaze deviation, aphasia, R sided weakness - seen by teleneurology with a NIHSS of 26, tPA given at 02/25/2020 at 2200. Intubated - hypotensive - Tx'd to [facility] for thrombectomy at 03/10/20 3:40 AM - Left M1 occlusion. Final is TICI 2b.   Stroke: left MCA large infarct - embolic - possibly due to hypercoagulable state with COVID  CT Head 8/20 - Mild age related volume loss.  Otherwise normal head CT.   CT head  8/21 - Worsening cytotoxic edema pattern with loss of gray-white interface within the left temporal lobe and left insula. ASPECTS is now 6. No hemorrhage or mass effect.  CTA H&N 8/20 - Acute large vessel occlusion of the left M1 segment with poor collaterals.  CT Perfusion - positive penumbra   IR - left M1 occlusion with TICI2b revascularization  CT Head WO Contrast - pending  MRI head - pending  MRA head - pending LE Doppler - b/l posterior tibial and peroneal veins DVT  2D Echo - pending  Sars Corona Virus 2 - positive  LDL - 61  HgbA1c - 5.8  VTE prophylaxis - Lovenox  No antithrombotic prior to admission, now on ASA 325  Ongoing aggressive stroke risk factor management  Therapy recommendations:  pending  Disposition:  Pending  COVID pneumonia Respiratory failure Sepsis Septic shock  Admitted 8/16 for Covid pneumonia treatment  Septic shock 8/20 on Neo  On Solu-Medrol, remdesivir and baricitinib  On ventilation  CCM on board  Fibrin ordered level 619->390  CRP 8.9->8.4  Ferritin 1126-> 950  D-dimer 3.99 -> > 20  lactic acid 2.8   Hypotension History of hypertension  Home BP meds: Lotensin  Current BP meds: Cleviprex and Neo PRN . BP goal < 180/105 post tPA . Long-term BP goal normotensive  Hyperlipidemia  Home Lipid lowering medication: Pravastatin 40 mg daily  LDL 61, goal < 70  Current lipid lowering medication: Pravastatin 40 mg daily  Continue statin at discharge  Other Stroke Risk Factors  Advanced age   Former cigarette smoker - quit  Obesity, Body mass index is 30.78 kg/m., recommend weight loss, diet and exercise as appropriate   Other Active Problems  Code status - Full Code  Anemia - Hgb - 15.7->14.5->11.2->9.2  Hypernatremia - 137->144->147->150  Hypokalemia - 3.6->3.0->2.6->2.4 supplemented  Aortic atherosclerosis  CKD - stage 3a  - creatinine - 1.41 - 1.15  Hospital day # 0  This patient is  critically ill due to large left MCA infarct, left MCA occlusion, status post thrombectomy and TPA, Covid infection, respiratory failure, sepsis, septic shock and at significant risk of neurological worsening, death form recurrent stroke, hemorrhagic conversion, brain edema, cerebral herniation, seizure, heart failure, ARDS, septic shock. This patient's care requires constant monitoring of vital signs, hemodynamics, respiratory and cardiac monitoring, review of multiple databases, neurological assessment, discussion with family, other specialists and medical decision making of high complexity. I spent 40 minutes of neurocritical care time in the care of this patient.  I discussed with CCM Dr. Tonia Brooms.  Marvel Plan, MD PhD Stroke Neurology 03/10/2020 7:03 PM   To contact Stroke Continuity provider, please refer to [email or web address]. After hours, contact General Neurology

## 2020-03-10 NOTE — Progress Notes (Signed)
OT Cancellation Note  Patient Details Name: Deanna Larsen MRN: [id number] DOB: [birth year]   Cancelled Treatment:    Reason Eval/Treat Not Completed: Patient not medically ready.  Spoke with MD.  OT will sign off at this time, as pt medically unstable, and won't be appropriate for therapies in the near future.  Please reorder when appropriate.  Eber Jones., OTR/L Acute Rehabilitation Services Pager [phone] Office [phone]   [name] 03/10/2020, 9:12 AM

## 2020-03-10 NOTE — Consult Note (Addendum)
NEUROLOGY CONSULTATION NOTE   Date of service: March 10, 2020 Patient Name: Deanna Larsen MRN:  [id number] DOB:  07-24-1950 Reason for consult: "Stroke code"  History of Present Illness  Deanna Larsen is a 69 y.o. female with PMH significant for HTN, HLD, Hypothyroid, GERD, IBS who initially was seen at [facility] for hypoxia 2/2 COVID Pneumonia. Developed acute onset L gaze deviation, aphasia, R sided weakness with a LKW of 2120 on 02/24/2020. CTH, CTA, CTP with ? hyperdense L MCA, Left M1 and ? L insular hypodensity but ASPECT documented as 10. CTA with L MCA M1 occlusion with a core of 68ml and of mismatch. She was seen by teleneurology with a NIHSS of 26, tPA given, mRS per conversation with teleneurologist is 0. She was intubated in the ED at [facility] and noted to be hypotensive. She was stabilized and transferred to [facility] for urgent thrombectomy.  Given drop in SBP at [facility], we repeated [facility] to re-evaluate her ASPECT score which was now a 6.   ROS   Unable to obtain. Patient is intubated.  Past History   Past Medical History:  Diagnosis Date  . Acid reflux   . Arthritis   . Hyperlipidemia   . Hypertension   . Hypothyroid   . IBS (irritable bowel syndrome)    Past Surgical History:  Procedure Laterality Date  . ABDOMINAL HYSTERECTOMY    . BILATERAL OOPHORECTOMY    . BIOPSY  03/25/2019   Procedure: BIOPSY;  Surgeon: West Bali, MD;  Location: [facility] ENDO SUITE;  Service: Endoscopy;;  gastric  . CHOLECYSTECTOMY    . COLONOSCOPY    . COLONOSCOPY N/A 10/16/2016   redundant rectosigmoid colon, multiple diverticula in rectosigmoid and sigmoid, internal hemorrhoids, repeat in 2028.   Marland Kitchen ESOPHAGOGASTRODUODENOSCOPY N/A 03/25/2019   Procedure: ESOPHAGOGASTRODUODENOSCOPY (EGD);  Surgeon: West Bali, MD;  Location: [facility] ENDO SUITE;  Service: Endoscopy;  Laterality: N/A;  2:15PM  . HIP SURGERY Right   . JOINT REPLACEMENT    . SHOULDER SURGERY      Family History  Problem Relation Age of Onset  . Heart disease Father   . Diabetes Brother   . Colon cancer Neg Hx   . Colon polyps Neg Hx    Social History   Socioeconomic History  . Marital status: Widowed    Spouse name: Not on file  . Number of children: Not on file  . Years of education: Not on file  . Highest education level: Not on file  Occupational History  . Occupation: retired  Tobacco Use  . Smoking status: Former Smoker    Packs/day: 1.50    Years: 20.00    Pack years: 30.00    Quit date: 09/21/2005    Years since quitting: 14.4  . Smokeless tobacco: Never Used  Vaping Use  . Vaping Use: Never used  Substance and Sexual Activity  . Alcohol use: No  . Drug use: No  . Sexual activity: Not Currently    Birth control/protection: Surgical, Post-menopausal  Other Topics Concern  . Not on file  Social History Narrative  . Not on file   Social Determinants of Health   Financial Resource Strain:   . Difficulty of Paying Living Expenses: Not on file  Food Insecurity:   . Worried About Programme researcher, broadcasting/film/video in the Last Year: Not on file  . Ran Out of Food in the Last Year: Not on file  Transportation Needs:   .  Lack of Transportation (Medical): Not on file  . Lack of Transportation (Non-Medical): Not on file  Physical Activity:   . Days of Exercise per Week: Not on file  . Minutes of Exercise per Session: Not on file  Stress:   . Feeling of Stress : Not on file  Social Connections:   . Frequency of Communication with Friends and Family: Not on file  . Frequency of Social Gatherings with Friends and Family: Not on file  . Attends Religious Services: Not on file  . Active Member of Clubs or Organizations: Not on file  . Attends Banker Meetings: Not on file  . Marital Status: Not on file   Allergies  Allergen Reactions  . Augmentin [Amoxicillin-Pot Clavulanate] Nausea And Vomiting    Did it involve swelling of the face/tongue/throat, SOB, or  low BP? No Did it involve sudden or severe rash/hives, skin peeling, or any reaction on the inside of your mouth or nose? No Did you need to seek medical attention at a hospital or doctor's office? No When did it last happen?5 years If all above answers are "NO", may proceed with cephalosporin use.      Medications  (Not in a hospital admission)    Vitals  Pulse Rate:  [83-115] 89 (08/21 0000) Resp:  [17-31] 28 (08/21 0000) BP: (85-182)/(48-128) 85/50 (08/21 0000) SpO2:  [80 %-97 %] 90 % (08/21 0000) FiO2 (%):  [100 %] 100 % (08/20 2315) Weight:  [83.9 kg] 83.9 kg (08/20 2134)  Body mass index is 30.78 kg/m.  Physical Exam   General: Laying comfortably in bed; in no acute distress.  HENT: Normal oropharynx and mucosa. Normal external appearance of ears and nose. Neck: Supple, no pain or tenderness CV: No JVD. No peripheral edema. Pulmonary: Symmetric Chest rise. Normal respiratory effort. Abdomen: Soft to touch, non-tender Ext: No cyanosis, edema, or deformity  Skin: No rash. Normal palpation of skin.   Musculoskeletal: Normal digits and nails by inspection. No clubbing.  Neurologic Examination  Mental status/Cognition: intubated, eyes closed, opens to loud voice and to noxious stimuli. Speech/language: Unable to assess. Cranial nerves:   CN II Pupils 50mm BL, equal and reactive to light. Unable to assess if she has VF deficit.   CN III,IV,VI Has left gaze preference. Does not cross the midline.   CN V    CN VII No notable facial droop, but intubated with tube secured to face making it difficult to assess.   CN VIII Does open eyes to loud voice.   CN IX & X -   CN XI -   CN XII    Motor:  Muscle bulk: normal, tone normal  Spontaneous movement noted in LUE and LLE, withdraws to pain in RLE. No response to pain in RUE.  Sensation: Localizes in LUE and LLE. Withdraws to pain in RLE No movement in RLE.  Coordination/Complex Motor:  Unable to assess.  Labs    Lab Results  Component Value Date   NA 144 02/29/2020   K 3.0 (L) 02/21/2020   CL 110 03/01/2020   CO2 23 03/05/2020   GLUCOSE 145 (H) 03/19/2020   BUN 24 (H) 02/20/2020   CREATININE 1.15 (H) 03/16/2020   CALCIUM 8.1 (L) 03/19/2020   ALBUMIN 2.9 (L) 02/19/2020   AST 51 (H) 03/16/2020   ALT 34 02/28/2020   ALKPHOS 42 03/08/2020   BILITOT 1.0 02/20/2020   GFRNONAA 49 (L) 03/07/2020   GFRAA 57 (L) 02/20/2020  Imaging and Diagnostic studies  CTH without contrast: 1. Mild age related volume loss.  Otherwise normal head CT. 2. ASPECTS is 10 3. These results were called by telephone at the time of interpretation on 03-Apr-2020 at 9:53 pm to provider [facility] ZAMMIT , who verbally acknowledged these results.  CTA Head and Neck: Acute large vessel occlusion of the left M1 segment with poor collaterals. 7 mL core infarction in the insula. 150 cc penumbra throughout the remainder of the MCA territory. This number is probably exaggerated because of inclusion of some areas not in the right MCA territory. True penumbra is probably in the range of 100-120 cc. Aortic atherosclerosis. No significant carotid bifurcation stenosis.  CT Perfusion: Acute large vessel occlusion of the left M1 segment with poor collaterals. 7 mL core infarction in the insula. 150 cc penumbra throughout the remainder of the MCA territory. This number is probably exaggerated because of inclusion of some areas not in the right MCA territory. True penumbra is probably in the range of 100-120 cc. Aortic atherosclerosis. No significant carotid bifurcation stenosis.  Impression   Deanna Larsen is a 69 y.o. female PMH significant for HTN, HLD, Hypothyroid, GERD, IBS who initially was seen at [facility] for hypoxia 2/2 COVID Pneumonia. Developed acute onset L gaze deviation, aphasia, R sided weakness with a LKW of 2120 on 04/03/2020. NIHSS of 26 at OSH, repeat NIHSS here is still 26. Baseline premorbid  mRS of 0. Initial CTH with ASPECT of 10, CTA Head and Neck with Left M1 occlusion with 120cc penumbra, core of 7cc. She was given tPA and intubated in the ED at [facility] and transferred to [facility] for evaluation for thrombectomy. She had a drop in her blood pressure at [facility] and therefore we decided to repeat the [facility] here with an ASPECT of 6 here.  We eventually decided to take her for thrombectomy here.  Recommendations  - Frequent NeuroChecks per stroke unit protocol. - Initial CTH demonstrated no acute hemorrhage or mass - Repeat CTH with ASPECT of 6. - MRI Brain - ordered but pending - CTA - with L MCA M1 occlusion. - CT Perfusion - 120cc mismatch, 7cc core - TTE - pending - Lipid Panel: LDL - pending  - Statin: pending LDL. - HbA1c value: pending - Antithrombotic: Start ASA 81 mg daily if 24 h CTH does not show acute hemorrhage. - DVT prophylaxis: SCDs. Pharmacologic prophylaxis if 24 h CTH does not demonstrate acute hemorrhage - Systolic Blood Pressure goal: per Neuro IR after intervention. - Telemetry monitoring for arrhythmia: 72 hours - Swallow screen - ordered - PT/OT/SLP consults  Hyperlipidemia: - LDL pending - continue home Pravastatin in the meantime  Hypothyroidism: - continue home synthroid 137mg  daily.   COVID Pneumonia resulting in hypoxic respiratory failure and intubation: - Defer management to ICU team. ______________________________________________________________________   Thank you for the opportunity to take part in the care of this patient. If you have any further questions, please contact the neurology consultation attending.  Signed,  [facility] Pager Number [phone] Blinks

## 2020-03-10 NOTE — Progress Notes (Signed)
Pt transported from IR to 4n23 without complications

## 2020-03-10 NOTE — Sedation Documentation (Signed)
Pt to be admitted to 4N23 per Amalia Hailey, RN, 4N Charge. Transport called to request bed be brought to IR2.

## 2020-03-10 NOTE — Procedures (Signed)
Neuro-Interventional Radiology  Post Cerebral Angiogram Procedure Note  Operator:    Dr. Loreta Ave Assistant:   None  History:   69 yo female with COVID symptoms, and acute ictus observed in the ED at [facility].  She is tx'd for acute ELVO treatment of the left MCA  Baseline mRS:  0 NIHSS:   26 Site of occlusion:  Left MCA   Procedure: US guided right CFA access Cervical & Cerebral Angiogram Mechanical Thrombectomy Deployment of Angioseal Flat Panel CT in NIR  TICI 0 on inital  First Pass Device:  Aspiration, [id number]  Result   TICI 0  Second Pass Device: Aspiration with 4x40 solitaire  Result   TICI 0  Third Pass Device:  Aspiration with 5x37 Embotrap  Result   TICI 2b  Findings:   FMD of left cervical ICA, recommend initiation of ASA as soon as possible     Left M1 occlusion     Final is TICI 2b     Flat panel CT shows staining of left MCA territory, compatible with early infarction   Anesthesia:   GETA  EBL:    100     Complication:  None   Medication: IV tPA administered?: yes IA Medication:  no  Recommendations: - right hip straight overnight - Goal SBP 140-160.   - Frequent NV checks - Repeat CT or MRI imaging recommended within 36 hours, discretion of Neurology - NIR to follow - recommend start aspirin/anti-platelet ASAP secondary to cervical ICA changes of FMD  Signed,  Yvone Neu. Loreta Ave, DO

## 2020-03-10 NOTE — Progress Notes (Signed)
Spoke with Dr. Tonia Brooms concerning PICC order. Patient had an embolic stroke and was given full dose(75.5mg ) TPA on 03/10/2020 at 2149. Patient needs central access for vasoactive and multiple medication administration. Patient is also to be proned. Considering her needs,  PICC placement is a more appropriate line vs. IJ, subclavian or femoral central line placement.

## 2020-03-10 NOTE — Progress Notes (Signed)
Patient's ETT was taped & secured with cloth tape at 22 at the lip. Patient was proned at 13:10 with head facing to the left. Patient's head was turned to the right at 15:10.

## 2020-03-10 NOTE — Progress Notes (Signed)
Initial Nutrition Assessment  DOCUMENTATION CODES:   Not applicable  INTERVENTION:  Vital HP @ 30 ml/hr with 45 ml ProSource TF 5x/day via tube -This regimen provides 920 kcal (1712 kcal with propofol at current rate), 118 grams protein, and 605 ml free water  Free water flushes per MD  MVI with minerals daily via tube  High refeed risk, continue to monitor Mg, K, and P daily  NUTRITION DIAGNOSIS:   Inadequate oral intake related to inability to eat as evidenced by NPO status.  GOAL:   Provide needs based on ASPEN/SCCM guidelines  MONITOR:   Labs, I & O's, Weight trends, TF tolerance, Vent status  REASON FOR ASSESSMENT:   Ventilator, Consult Enteral/tube feeding initiation and management  ASSESSMENT:  RD working remotely.  69 year old female transferred from [facility] with left MCA LVO who was recently diagnosed with Covid-19 (8/16) presented with worsening SOB, developed focal neurologic symptoms while being triaged. Past medical history significant of HTN, HLD, hypothyroidism, IBS, GERD.  Patient is s/p mechanical thrombectomy on 8/20  BP: 146/68 (a-line) MAP: 99 (a-line) I/O: +1247 ml since admit  Patient is currently sedated and intubated on ventilator support MV: 11.6 L/min Temp (24hrs), Avg:99.8 F (37.7 C), Min:99.8 F (37.7 C), Max:99.8 F (37.7 C)  Propofol: 30 ml/hr providing 792 kcal  Medications reviewed and include: Colace, Miralax, SSI, Methylprednisolone, KCl IVF: NaCl @ 75 ml/hr Mg sulfate 2 g Precedex  Fentanyl Neo Remdesivir  Labs: CBG 134, Na 150 (H) trending up K 2.4 (L) trending down  NUTRITION - FOCUSED PHYSICAL EXAM: Unable to complete at this time, RD working remotely.  Diet Order:   Diet Order            Diet NPO time specified  Diet effective now                 EDUCATION NEEDS:   Not appropriate for education at this time  Skin:  Skin Assessment: Reviewed RN Assessment  Last BM:  8/21 - type 6  Height:   Ht  Readings from Last 1 Encounters:  03/06/2020 5\' 5"  (1.651 m)    Weight:   Wt Readings from Last 1 Encounters:  03/10/2020 83.9 kg    Ideal Body Weight:  56.8 kg  BMI:  Body mass index is 30.78 kg/m.  Estimated Nutritional Needs:   Kcal:  03/14/2020  Protein:  114  Fluid:  > 1.3 L/day   [id number], RD, LDN Clinical Nutrition After Hours/Weekend Pager # in Amion

## 2020-03-10 NOTE — Sedation Documentation (Signed)
SBAR given to [employer], Charity fundraiser at bedside. All questions answered to satisfaction. Groin and pulses unchanged, see flowsheet.

## 2020-03-10 NOTE — Progress Notes (Signed)
Spoke with OT who reported that per MD, patient will not be appropriate for therapies in the near future as she is medically unstable. Signing off. Please re order when appropriate.   Bushra Denman MA, CCC-SLP

## 2020-03-11 ENCOUNTER — Other Ambulatory Visit (HOSPITAL_COMMUNITY): Payer: Medicare Other

## 2020-03-11 LAB — D-DIMER, QUANTITATIVE: D-Dimer, Quant: >20 ug/mL-FEU — ABNORMAL HIGH (ref 0.00–0.50)

## 2020-03-11 LAB — CBC WITH DIFFERENTIAL/PLATELET
Abs Immature Granulocytes: 0.25 10*3/uL — ABNORMAL HIGH (ref 0.00–0.07)
Basophils Absolute: 0 10*3/uL (ref 0.0–0.1)
Basophils Relative: 0 %
Eosinophils Absolute: 0.3 10*3/uL (ref 0.0–0.5)
Eosinophils Relative: 2 %
HCT: 42.8 % (ref 36.0–46.0)
Hemoglobin: 13.7 g/dL (ref 12.0–15.0)
Immature Granulocytes: 1 %
Lymphocytes Relative: 6 %
Lymphs Abs: 1.1 10*3/uL (ref 0.7–4.0)
MCH: 30.7 pg (ref 26.0–34.0)
MCHC: 32 g/dL (ref 30.0–36.0)
MCV: 96 fL (ref 80.0–100.0)
Monocytes Absolute: 0.5 10*3/uL (ref 0.1–1.0)
Monocytes Relative: 3 %
Neutro Abs: 15.9 10*3/uL — ABNORMAL HIGH (ref 1.7–7.7)
Neutrophils Relative %: 88 %
Platelets: 196 10*3/uL (ref 150–400)
RBC: 4.46 MIL/uL (ref 3.87–5.11)
RDW: 14 % (ref 11.5–15.5)
WBC: 18 10*3/uL — ABNORMAL HIGH (ref 4.0–10.5)
nRBC: 0 % (ref 0.0–0.2)

## 2020-03-11 LAB — C-REACTIVE PROTEIN: CRP: 14.7 mg/dL — ABNORMAL HIGH (ref <1.0)

## 2020-03-11 LAB — COMPREHENSIVE METABOLIC PANEL
ALT: 29 U/L (ref 0–44)
AST: 27 U/L (ref 15–41)
Albumin: 2 g/dL — ABNORMAL LOW (ref 3.5–5.0)
Alkaline Phosphatase: 44 U/L (ref 38–126)
Anion gap: 11 (ref 5–15)
BUN: 18 mg/dL (ref 8–23)
CO2: 18 mmol/L — ABNORMAL LOW (ref 22–32)
Calcium: 7.9 mg/dL — ABNORMAL LOW (ref 8.9–10.3)
Chloride: 120 mmol/L — ABNORMAL HIGH (ref 98–111)
Creatinine, Ser: 1.07 mg/dL — ABNORMAL HIGH (ref 0.44–1.00)
GFR calc Af Amer: >60 mL/min (ref >60)
GFR calc non Af Amer: 53 mL/min — ABNORMAL LOW (ref >60)
Glucose, Bld: 207 mg/dL — ABNORMAL HIGH (ref 70–99)
Potassium: 4.3 mmol/L (ref 3.5–5.1)
Sodium: 149 mmol/L — ABNORMAL HIGH (ref 135–145)
Total Bilirubin: 0.8 mg/dL (ref 0.3–1.2)
Total Protein: 5.1 g/dL — ABNORMAL LOW (ref 6.5–8.1)

## 2020-03-11 LAB — FERRITIN: Ferritin: 1061 ng/mL — ABNORMAL HIGH (ref 11–307)

## 2020-03-11 LAB — MAGNESIUM: Magnesium: 3.2 mg/dL — ABNORMAL HIGH (ref 1.7–2.4)

## 2020-03-11 LAB — PHOSPHORUS: Phosphorus: 3.9 mg/dL (ref 2.5–4.6)

## 2020-03-11 LAB — GLUCOSE, CAPILLARY: Glucose-Capillary: 85 mg/dL (ref 70–99)

## 2020-03-11 MED ORDER — ACETAMINOPHEN 325 MG PO TABS: 650.0000 mg | ORAL_TABLET | Freq: Four times a day (QID) | ORAL | Status: DC | PRN

## 2020-03-11 MED ORDER — ENOXAPARIN SODIUM 40 MG/0.4ML ~~LOC~~ SOLN: 40.0000 mg | Freq: Every day | SUBCUTANEOUS | Status: DC

## 2020-03-11 MED ORDER — GLYCOPYRROLATE 0.2 MG/ML IJ SOLN: 0.2000 mg | INTRAMUSCULAR | Status: DC | PRN

## 2020-03-11 MED ORDER — PANTOPRAZOLE SODIUM 40 MG PO PACK: 40.0000 mg | PACK | Freq: Every day | ORAL | Status: DC

## 2020-03-11 MED ORDER — MIDAZOLAM BOLUS VIA INFUSION (WITHDRAWAL LIFE SUSTAINING TX)
2.0000 mg | INTRAVENOUS | Status: DC | PRN
  Filled 2020-03-11: qty 2

## 2020-03-11 MED ORDER — ACETAMINOPHEN 650 MG RE SUPP: 650.0000 mg | Freq: Four times a day (QID) | RECTAL | Status: DC | PRN

## 2020-03-11 MED ORDER — DIPHENHYDRAMINE HCL 50 MG/ML IJ SOLN: 25.0000 mg | INTRAMUSCULAR | Status: DC | PRN

## 2020-03-11 MED ORDER — GLYCOPYRROLATE 1 MG PO TABS
1.0000 mg | ORAL_TABLET | ORAL | Status: DC | PRN
  Filled 2020-03-11: qty 1

## 2020-03-11 MED ORDER — MORPHINE SULFATE (PF) 2 MG/ML IV SOLN: 2.0000 mg | INTRAVENOUS | Status: DC | PRN

## 2020-03-11 MED ORDER — MIDAZOLAM HCL 2 MG/2ML IJ SOLN: 1.0000 mg | INTRAMUSCULAR | Status: DC | PRN

## 2020-03-11 MED ORDER — MORPHINE 100MG IN NS 100ML (1MG/ML) PREMIX INFUSION
0.0000 mg/h | INTRAVENOUS | Status: DC
  Administered 2020-03-11: 5 mg/h via INTRAVENOUS
  Administered 2020-03-11: 20 mg/h via INTRAVENOUS
  Administered 2020-03-11: 5 mg/h via INTRAVENOUS
  Filled 2020-03-11 (×2): qty 100

## 2020-03-11 MED ORDER — POLYVINYL ALCOHOL 1.4 % OP SOLN
1.0000 [drp] | Freq: Four times a day (QID) | OPHTHALMIC | Status: DC | PRN
  Filled 2020-03-11: qty 15

## 2020-03-11 MED ORDER — MORPHINE BOLUS VIA INFUSION
5.0000 mg | INTRAVENOUS | Status: DC | PRN
  Filled 2020-03-11: qty 5

## 2020-03-11 MED ORDER — DEXTROSE 5 % IV SOLN: INTRAVENOUS | Status: DC

## 2020-03-11 MED ORDER — MIDAZOLAM 50MG/50ML (1MG/ML) PREMIX INFUSION
0.0000 mg/h | INTRAVENOUS | Status: DC
  Administered 2020-03-11: 1 mg/h via INTRAVENOUS
  Administered 2020-03-11: 6 mg/h via INTRAVENOUS
  Filled 2020-03-11 (×3): qty 50

## 2020-03-12 ENCOUNTER — Encounter (HOSPITAL_COMMUNITY): Payer: Self-pay | Admitting: Interventional Radiology

## 2020-03-12 LAB — GLUCOSE, CAPILLARY
Glucose-Capillary: 169 mg/dL — ABNORMAL HIGH (ref 70–99)
Glucose-Capillary: 144 mg/dL — ABNORMAL HIGH (ref 70–99)

## 2020-03-12 NOTE — Progress Notes (Signed)
80 mL Morphine & 60 mL Versed wasted in Stericycle with [place] Allen, RN

## 2020-03-14 MED FILL — Fentanyl Citrate Preservative Free (PF) Inj 100 MCG/2ML: INTRAMUSCULAR | Qty: 2 | Status: AC

## 2020-03-21 NOTE — Anesthesia Postprocedure Evaluation (Signed)
Anesthesia Post Note  Patient: AMAURIE SCHRECKENGOST  Procedure(s) Performed: IR WITH ANESTHESIA (N/A )     Patient location during evaluation: ICU Anesthesia Type: General Level of consciousness: sedated and patient remains intubated per anesthesia plan Pain management: pain level controlled Vital Signs Assessment: vitals unstable Respiratory status: patient on ventilator - see flowsheet for VS, patient remains intubated per anesthesia plan and respiratory function unstable (progression of COVID-19 lung injury) Cardiovascular status: stable Postop Assessment: no apparent nausea or vomiting Anesthetic complications: no Comments: Pt remains gravely ill with large MCA CVA and COVID-19 lung injury. Family progressing toward comfort care only. Condolences given to pt's daughter by telephone.   No complications documented.  Last Vitals:  Vitals:   02/26/2020 1515 03/14/2020 1534  BP: (!) 144/54 (!) 142/55  Pulse: (!) 53 (!) 54  Resp: (!) 23 (!) 26  Temp:    SpO2: 96% 96%    Last Pain:  Vitals:   02/25/2020 1330  TempSrc: Axillary                 Libni Fusaro,E. Mohsen Odenthal

## 2020-03-21 NOTE — Progress Notes (Signed)
Pt transported back to 4N 23 from MRI without event.

## 2020-03-21 NOTE — Progress Notes (Signed)
Patient supine 10:25 ABG to follow in 1 hr

## 2020-03-21 NOTE — Progress Notes (Signed)
Pt transported to MRI without event.  Pt suctioned before transport after being supined.  RT will continue to monitor.

## 2020-03-21 NOTE — Progress Notes (Signed)
NAME:  Deanna Larsen, MRN:  [id number], DOB:  1951/06/01, LOS: 1 ADMISSION DATE:  Mar 24, 2020, CONSULTATION DATE:  03/08/2020 REFERRING MD:  Neurology, CHIEF COMPLAINT:   Code stroke  Brief History   69 year old female transferred from [place] to [place] with past medical history of hypertension, hyperlipidemia, hypothyroidism who presented to the ER with worsening shortness of breath in the setting of known Covid-19 diagnosis.  While being triaged she developed focal neurologic symptoms and was diagnosed with left MCA LVO.  She was taken to IR for mechanical thrombectomy PCCM consulted to manage ventilator.  History of present illness   69 year old female with past medical history of hypertension, hyperlipidemia, GERD, hypothyroidism who tested positive for Covid-19 on 8/16, she was not a candidate for MAB.  She returned to the ER on 8/20 with hypoxia to 55% on 6 L, while being triaged patient suddenly developed left-sided gaze deviation, aphasia and right hemiparesis and code stroke was called, CT head was unremarkable, she received TPA and CTA showed left M1 LVO.  She was intubated and taken to the OR for mechanical thrombectomy and left intubated and transferred to intensive care.  Past Medical History   has a past medical history of Acid reflux, Arthritis, Hyperlipidemia, Hypertension, Hypothyroid, and IBS (irritable bowel syndrome).   Significant Hospital Events   8/20 mechanical thrombectomy and admit  Consults:  Neurology  Procedures:  8/20 ETT 8/20 mechanical thrombectomy  Significant Diagnostic Tests:  8/20 CTA>>Acute large vessel occlusion of the left M1 segment with poor collaterals. 7 mL core infarction in the insula. 150 cc penumbra throughout the remainder of the MCA territory. 8/21 CT head>>Worsening cytotoxic edema pattern with loss of gray-white interface within the left temporal lobe and left insula 8/20 CXR>>Moderate severity diffuse interstitial infiltrates  increased in severity when compared to the prior study.3. Mild stable multifocal infiltrates.  Micro Data:  8/21 MRSA>> 8/21 blood cultures x2>>  Antimicrobials:  8/21 ceftriaxone 8/29 remdesivir  Interim history/subjective:   Critically ill intubated on mechanical life support.  Objective   Blood pressure (!) 150/64, pulse (!) 53, temperature 98.5 F (36.9 C), temperature source Oral, resp. rate (!) 24, height 5\' 5"  (1.651 m), weight 86.9 kg, SpO2 100 %.    Vent Mode: PRVC FiO2 (%):  [100 %] 100 % Set Rate:  [26 bmp] 26 bmp Vt Set:  [360 mL] 360 mL PEEP:  [12 cmH20] 12 cmH20 Plateau Pressure:  [20 cmH20] 20 cmH20   Intake/Output Summary (Last 24 hours) at 03/20/2020 1010 Last data filed at 03/06/2020 0700 Gross per 24 hour  Intake 3750.02 ml  Output 1655 ml  Net 2095.02 ml   Filed Weights   2020-03-24 2134 02/25/2020 0500  Weight: 83.9 kg 86.9 kg    General appearance: 69 y.o., female, critically ill intubated on mechanical life support HENT: Endotracheal tube in place Lungs: Bilateral ventilated breath sounds CV: Regular rate rhythm, S1-S2 no MRG Abdomen: Soft, nontender nondistended Extremities: No peripheral edema Skin: No rash Psych: Appropriate affect Neuro: Alert and oriented to person and place, no focal deficit   Resolved Hospital Problem list     Assessment & Plan:   Acute left MCA CVA, large vessel occlusion in the setting of Covid-19 Large territory left-sided MCA with cytotoxic cerebral edema and midline shift S/p TPA and mechanical thrombectomy, appreciate neurology recommendations Plan: Patient was proned yesterday.  Oxygen saturations did improve. MRI was completed Not a candidate for decompressive hemicraniectomy due to critical illness related to COVID-19.  I do not think that she would tolerate the surgery I had a goals of care discussion with the patient's daughter and son. This discussion was witnessed via phone by patient's bedside  nurse. They have agreed to that with the patient's multiple medical comorbidities and everything that has been on within the past 24 hours that she would not want prolonged mechanical support if her outcomes were poor. I do believe that the stroke and chance of her rehabilitation if she was to survive the Covid ARDS would be nearly impossible.  Patient's family agrees. Once they are ready they have agreed that they would like to proceed with comfort care withdrawal  Acute hypoxic Respiratory Failure secondary to Covid-19 PNA ARDS secondary to COVID-19 pneumonia -start Remdesevir, Steroids 1mg /kg Patient was proned yesterday Oxygen saturations did improve Continue therapy for COVID-19 to include antiviral, steroids and barcitinib   Hypokalemia -received K during procedure by report, will order additional 72meq's and Mag  Best practice:  Diet: NPO Pain/Anxiety/Delirium protocol (if indicated): propofol, fentanyl VAP protocol (if indicated): HOB 30degrees, suction prn DVT prophylaxis: SCD's GI prophylaxis: protonix Glucose control: SSI Mobility: bed rest Code Status: DNR  Family Communication:   43m (daughter) [employer] Employee  Brother - [phone]   Disposition: ICU  Labs   CBC: Recent Labs  Lab 03/05/20 0937 03/05/20 0937 02/29/2020 2126 03/05/2020 2126 03/10/20 0226 03/10/20 0331 03/10/20 1003 03/10/20 1508 Apr 01, 2020 0500  WBC 3.6*  --  4.9  --   --   --  5.7  --  18.0*  NEUTROABS 2.4  --  3.8  --   --   --   --   --  15.9*  HGB 15.7*   < > 14.5   < > 11.2* 9.2* 12.2 12.2 13.7  HCT 47.6*   < > 43.2   < > 33.0* 27.0* 37.0 36.0 42.8  MCV 91.9  --  90.8  --   --   --  91.6  --  96.0  PLT 125*  --  198  --   --   --  158  --  196   < > = values in this interval not displayed.    Basic Metabolic Panel: Recent Labs  Lab 03/05/20 0937 03/05/20 0937 03/14/2020 2126 03/04/2020 2126 03/10/20 0226 03/10/20 0331 03/10/20 1003 03/10/20 1508 04/01/20 0500  NA 137   < > 144    < > 147* 150* 146* 146* 149*  K 3.6   < > 3.0*   < > 2.6* 2.4* 2.9* 4.5 4.3  CL 105  --  110  --   --   --  115*  --  120*  CO2 21*  --  23  --   --   --  20*  --  18*  GLUCOSE 122*  --  145*  --   --   --  140*  --  207*  BUN 34*  --  24*  --   --   --  15  --  18  CREATININE 1.41*  --  1.15*  --   --   --  0.85  --  1.07*  CALCIUM 8.2*  --  8.1*  --   --   --  6.9*  --  7.9*  MG  --   --   --   --   --   --  2.0  --  3.2*  PHOS  --   --   --   --   --   --  3.1  --  3.9   < > = values in this interval not displayed.   GFR: Estimated Creatinine Clearance: 54.8 mL/min (A) (by C-G formula based on SCr of 1.07 mg/dL (H)). Recent Labs  Lab 03/05/20 0937 03/03/2020 2126 03/10/20 1003 03/10/2020 0500  PROCALCITON  --  0.11  --   --   WBC 3.6* 4.9 5.7 18.0*  LATICACIDVEN  --  2.8*  --   --     Liver Function Tests: Recent Labs  Lab 03/05/20 0937 02/20/2020 2126 03/10/20 1003 03/20/2020 0500  AST 64* 51* 43* 27  ALT 32 34 29 29  ALKPHOS 45 42 35* 44  BILITOT 0.5 1.0 0.7 0.8  PROT 7.3 6.5 4.8* 5.1*  ALBUMIN 3.8 2.9* 1.9* 2.0*   No results for input(s): LIPASE, AMYLASE in the last 168 hours. No results for input(s): AMMONIA in the last 168 hours.  ABG    Component Value Date/Time   PHART 7.250 (L) 03/10/2020 1508   PCO2ART 50.5 (H) 03/10/2020 1508   PO2ART 296 (H) 03/10/2020 1508   HCO3 22.0 03/10/2020 1508   TCO2 23 03/10/2020 1508   ACIDBASEDEF 5.0 (H) 03/10/2020 1508   O2SAT 100.0 03/10/2020 1508     Coagulation Profile: Recent Labs  Lab 03/17/2020 2126  INR 1.1    Cardiac Enzymes: No results for input(s): CKTOTAL, CKMB, CKMBINDEX, TROPONINI in the last 168 hours.  HbA1C: Hgb A1c MFr Bld  Date/Time Value Ref Range Status  03/10/2020 05:00 AM 5.8 (H) 4.8 - 5.6 % Final    Comment:    (NOTE) Pre diabetes:          5.7%-6.4%  Diabetes:              >6.4%  Glycemic control for   <7.0% adults with diabetes     CBG: Recent Labs  Lab 03/10/20 0908  03/10/20 1202 03/10/20 1624 03/10/20 2013 03/10/20 2345  GLUCAP 134* 133* 140* 164* 161*    Review of Systems:   Unable to obtain secondary to mental status  Past Medical History  She,  has a past medical history of Acid reflux, Arthritis, Hyperlipidemia, Hypertension, Hypothyroid, and IBS (irritable bowel syndrome).   Surgical History    Past Surgical History:  Procedure Laterality Date  . ABDOMINAL HYSTERECTOMY    . BILATERAL OOPHORECTOMY    . BIOPSY  03/25/2019   Procedure: BIOPSY;  Surgeon: West BaliFields, Sandi L, MD;  Location: [facility];  Service: Endoscopy;;  gastric  . CHOLECYSTECTOMY    . COLONOSCOPY    . COLONOSCOPY N/A 10/16/2016   redundant rectosigmoid colon, multiple diverticula in rectosigmoid and sigmoid, internal hemorrhoids, repeat in 2028.   Marland Kitchen. ESOPHAGOGASTRODUODENOSCOPY N/A 03/25/2019   Procedure: ESOPHAGOGASTRODUODENOSCOPY (EGD);  Surgeon: West BaliFields, Sandi L, MD;  Location: [facility];  Service: Endoscopy;  Laterality: N/A;  2:15PM  . HIP SURGERY Right   . JOINT REPLACEMENT    . SHOULDER SURGERY       Social History   reports that she quit smoking about 14 years ago. She has a 30.00 pack-year smoking history. She has never used smokeless tobacco. She reports that she does not drink alcohol and does not use drugs.   Family History   Her family history includes Diabetes in her brother; Heart disease in her father. There is no history of Colon cancer or Colon polyps.   Allergies Allergies  Allergen Reactions  . Augmentin [Amoxicillin-Pot Clavulanate] Nausea And Vomiting    Did it involve swelling  of the face/tongue/throat, SOB, or low BP? No Did it involve sudden or severe rash/hives, skin peeling, or any reaction on the inside of your mouth or nose? No Did you need to seek medical attention at a hospital or doctor's office? No When did it last happen?5 years If all above answers are "NO", may proceed with cephalosporin use.       Home Medications   Prior to Admission medications   Medication Sig Start Date End Date Taking? Authorizing Provider  acetaminophen (TYLENOL) 500 MG tablet Take 500 mg by mouth every 6 (six) hours as needed for moderate pain or headache.    [provider]  benazepril (LOTENSIN) 20 MG tablet TAKE 1 AND 1/2 TABLETS(30 MG) BY MOUTH DAILY 05/12/19   Merlyn Albert, MD  calcium carbonate (TUMS - DOSED IN MG ELEMENTAL CALCIUM) 500 MG chewable tablet Chew 2 tablets by mouth 2 (two) times daily as needed for indigestion or heartburn.     [provider]  cephALEXin (KEFLEX) 500 MG capsule Take 1 capsule (500 mg total) by mouth 2 (two) times daily for 7 days. 03/05/20 April 04, 2020  Burgess Amor, PA-C  levothyroxine (SYNTHROID) 137 MCG tablet Take 1 tablet (137 mcg total) by mouth daily. 05/12/19   Merlyn Albert, MD  pantoprazole (PROTONIX) 40 MG tablet 1 po 30 mins prior to meals bid 05/12/19   Merlyn Albert, MD  pravastatin (PRAVACHOL) 40 MG tablet Take 1 tablet (40 mg total) by mouth daily. 05/12/19   Merlyn Albert, MD    This patient is critically ill with multiple organ system failure; which, requires frequent high complexity decision making, assessment, support, evaluation, and titration of therapies. This was completed through the application of advanced monitoring technologies and extensive interpretation of multiple databases. During this encounter critical care time was devoted to patient care services described in this note for 32 minutes.  Josephine Igo, DO [facility] 02/20/2020 11:16 AM

## 2020-03-21 NOTE — Plan of Care (Signed)
MRI and MRA reviewed. Pt still has large left MCA and ACA infarct with hemorrhagic conversion and 82mm midline shift. The left ICA and MCA are still occluded. Discussed with Dr. Tonia Brooms, given her poor respiratory status due to COVID infection and large stroke with hemo conversion and occluded ICA and MCA, the prognosis is very poor. Dr. Tonia Brooms discussed with pt daughter and decision made for comfort care. I agree. Neurology will sign off, please call with questions.   Marvel Plan, MD PhD Stroke Neurology 03/19/2020 11:38 AM

## 2020-03-21 NOTE — Progress Notes (Signed)
Patient expired at 2230, confirmed with Karin Golden, RN

## 2020-03-21 NOTE — Procedures (Signed)
Extubation Procedure Note  Patient Details:   Name: Deanna Larsen DOB: [birth year] MRN: [id number]   Airway Documentation:    Vent end date: 03/09/2020 Vent end time: 1841   Evaluation  O2 sats: currently acceptable Complications: No apparent complications Patient did tolerate procedure well. Bilateral Breath Sounds: Diminished, Clear   No, Patient terminally weaned per MD order and family request.  Toula Moos 03/16/2020, 6:41 PM

## 2020-03-21 NOTE — Progress Notes (Signed)
CDS referral # [id number]

## 2020-03-21 NOTE — Progress Notes (Signed)
Pt turned back supine after 8 hours with no complications for MRI trip, PT was supine for two hours and then flipped back to the prone position at 00:00. Plan to prone for an additional 8 hours, and resupiune at 0800.

## 2020-03-21 DEATH — deceased

## 2020-03-22 DIAGNOSIS — A419 Sepsis, unspecified organism: Secondary | ICD-10-CM | POA: Diagnosis present

## 2020-03-22 DIAGNOSIS — R739 Hyperglycemia, unspecified: Secondary | ICD-10-CM | POA: Diagnosis present

## 2020-03-22 DIAGNOSIS — U071 COVID-19: Secondary | ICD-10-CM | POA: Diagnosis present

## 2020-04-20 NOTE — Death Summary Note (Signed)
DEATH SUMMARY   Patient Details  Name: Deanna Larsen MRN: [id number] DOB: May 03, 1951  Admission/Discharge Information   Admit Date:  2020-03-10  Date of Death: Date of Death: 02/24/2020  Time of Death: Time of Death: Nov 26, 2228  Length of Stay: 2  Referring Physician: Babs Sciara, MD   Reason(s) for Hospitalization  69 year old female transferred from [place] to [facility] with past medical history of hypertension, hyperlipidemia, hypothyroidism who presented to the ER with worsening shortness of breath in the setting of known Covid-19 diagnosis.  While being triaged she developed focal neurologic symptoms and was diagnosed with left MCA LVO.  She was taken to IR for mechanical thrombectomy PCCM consulted to manage ventilator.  Diagnoses  Preliminary cause of death: Acute respiratory distress syndrome (ARDS) due to COVID-19 virus (HCC) Secondary Diagnoses (including complications and co-morbidities):  Active Problems:   Cerebral embolism with cerebral infarction   Acute ischemic left MCA stroke (HCC)   COVID-19 virus infection   Acute hypoxemic respiratory failure due to COVID-19 [facility])   Hyperglycemia   Sepsis [facility])   Brief Hospital Course (including significant findings, care, treatment, and services provided and events leading to death)  Deanna Larsen is a 69 y.o. year old female who past medical history of hypertension, hyperlipidemia, GERD, hypothyroidism who tested positive for Covid-19 on 8/16, she was not a candidate for MAB.  She returned to the ER on 03-11-23 with hypoxia to 55% on 6 L, while being triaged patient suddenly developed left-sided gaze deviation, aphasia and right hemiparesis and code stroke was called, CT head was unremarkable, she received TPA and CTA showed left M1 LVO.  She was intubated and taken to the OR for mechanical thrombectomy and left intubated and transferred to intensive care.  The patient was proned due to severe hypoxemia from COVID-19  bilateral pneumonia, ARDS, and sepsis present on admission secondary to viral infection.   MRI brain imaging revealed cytotoxic edema with 9mm shift. She was took unstable from a respiratory standpoint to tolerate decompression by neurosurgery. We discussed these findings with the patients family and they elected to withdrawal care and transition to comfort.   Pertinent Labs and Studies  Significant Diagnostic Studies CT Angio Head W or Wo Contrast  Result Date: 03/10/20 CLINICAL DATA:  Coronavirus infection. Acute presentation with inability to speak. EXAM: CT ANGIOGRAPHY HEAD AND NECK CT PERFUSION BRAIN TECHNIQUE: Multidetector CT imaging of the head and neck was performed using the standard protocol during bolus administration of intravenous contrast. Multiplanar CT image reconstructions and MIPs were obtained to evaluate the vascular anatomy. Carotid stenosis measurements (when applicable) are obtained utilizing NASCET criteria, using the distal internal carotid diameter as the denominator. Multiphase CT imaging of the brain was performed following IV bolus contrast injection. Subsequent parametric perfusion maps were calculated using RAPID software. CONTRAST:  OMNIPAQUE IOHEXOL 350 MG/ML SOLN COMPARISON:  None. FINDINGS: CTA NECK FINDINGS Aortic arch: Aortic atherosclerosis. Branching pattern is normal without origin stenosis. Right carotid system: Common carotid artery widely patent to the bifurcation. Calcified plaque in the ICA bulb with stenosis of 20%. Cervical ICA widely patent beyond that. Left carotid system: Common carotid artery widely patent to the bifurcation. Minimal calcified plaque in the distal bulb but no stenosis. Cervical ICA widely patent beyond that. Vertebral arteries: Both vertebral arteries widely patent at their origins and through the cervical region to the foramen magnum. Skeleton: Ordinary spondylosis. Other neck: No mass or lymphadenopathy. Upper chest: Bilateral  pulmonary infiltrates consistent  with coronavirus pneumonia. Review of the MIP images confirms the above findings CTA HEAD FINDINGS Anterior circulation: Both internal carotid arteries are patent through the skull base and siphon regions. On the left, there is complete occlusion of the M1 segment. There appear to be poor collaterals. Left anterior cerebral artery shows flow. On the right, the anterior and middle cerebral arteries show flow. Posterior circulation: Both vertebral arteries patent to the basilar. No basilar stenosis. Posterior circulation branch vessels show flow. Venous sinuses: Patent and normal. Anatomic variants: None significant. 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 7 mL core infarction in the insula. 150 cc penumbra throughout the remainder of the MCA territory. This number is probably exaggerated because of inclusion of some areas not in the right MCA territory. True penumbra is probably in the range of 100-120 cc. Aortic atherosclerosis. No significant carotid bifurcation stenosis. These results were called by telephone during interpretation on 03/14/2020 at 10:16 pm to provider [facility] ZAMMIT , who verbally acknowledged these results. Electronically Signed   By: Paulina Fusi [name]   On: 03/15/2020 22:31   CT HEAD WO CONTRAST  Result Date: 03/10/2020 CLINICAL DATA:  Stroke follow-up EXAM: CT HEAD WITHOUT CONTRAST TECHNIQUE: Contiguous axial images were obtained from the base of the skull through the vertex without intravenous contrast. COMPARISON:  CT perfusion scan 02/21/2020 FINDINGS: Brain: There is no mass, hemorrhage or extra-axial collection. The size and configuration of the ventricles and extra-axial CSF spaces are normal.  Worsening cytotoxic edema pattern with loss of gray-white interface within the left temporal lobe and left insula. Vascular: No abnormal hyperdensity of the major intracranial arteries or dural venous sinuses. No intracranial atherosclerosis. Skull: The visualized skull base, calvarium and extracranial soft tissues are normal. Sinuses/Orbits: No fluid levels or advanced mucosal thickening of the visualized paranasal sinuses. No mastoid or middle ear effusion. The orbits are normal. ASPECTS [facility] Stroke Program Early CT Score) - Ganglionic level infarction (caudate, lentiform nuclei, internal capsule, insula, M1-M3 cortex): 3 - Supraganglionic infarction (M4-M6 cortex): 3 Total score (0-10 with 10 being normal): 6 IMPRESSION: 1. Worsening cytotoxic edema pattern with loss of gray-white interface within the left temporal lobe and left insula. ASPECTS is now 6. 2. No hemorrhage or mass effect. These results were called by telephone at the time of interpretation on 03/10/2020 at 1:51 am to providers [facility] and [facility] , who verbally acknowledged these results. Electronically Signed   By: Deatra Robinson [name]   On: 03/10/2020 01:52   CT Angio Neck W and/or Wo Contrast  Result Date: 02/20/2020 CLINICAL DATA:  Coronavirus infection. Acute presentation with inability to speak. EXAM: CT ANGIOGRAPHY HEAD AND NECK CT PERFUSION BRAIN TECHNIQUE: Multidetector CT imaging of the head and neck was performed using the standard protocol during bolus administration of intravenous contrast. Multiplanar CT image reconstructions and MIPs were obtained to evaluate the vascular anatomy. Carotid stenosis measurements (when applicable) are obtained utilizing NASCET criteria, using the distal internal carotid diameter as the denominator. Multiphase CT imaging of the brain was performed following IV bolus contrast injection. Subsequent parametric perfusion maps were calculated using RAPID software. CONTRAST:   OMNIPAQUE IOHEXOL 350 MG/ML SOLN COMPARISON:  None. FINDINGS: CTA NECK FINDINGS Aortic arch: Aortic atherosclerosis. Branching pattern is normal without origin stenosis. Right carotid  system: Common carotid artery widely patent to the bifurcation. Calcified plaque in the ICA bulb with stenosis of 20%. Cervical ICA widely patent beyond that. Left carotid system: Common carotid artery widely patent to the bifurcation. Minimal calcified plaque in the distal bulb but no stenosis. Cervical ICA widely patent beyond that. Vertebral arteries: Both vertebral arteries widely patent at their origins and through the cervical region to the foramen magnum. Skeleton: Ordinary spondylosis. Other neck: No mass or lymphadenopathy. Upper chest: Bilateral pulmonary infiltrates consistent with coronavirus pneumonia. Review of the MIP images confirms the above findings CTA HEAD FINDINGS Anterior circulation: Both internal carotid arteries are patent through the skull base and siphon regions. On the left, there is complete occlusion of the M1 segment. There appear to be poor collaterals. Left anterior cerebral artery shows flow. On the right, the anterior and middle cerebral arteries show flow. Posterior circulation: Both vertebral arteries patent to the basilar. No basilar stenosis. Posterior circulation branch vessels show flow. Venous sinuses: Patent and normal. Anatomic variants: None significant. 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 7 mL core infarction in the insula. 150 cc penumbra throughout the remainder of the MCA territory. This number is probably exaggerated because of inclusion of some areas not in the right MCA territory. True penumbra is probably in the range of  100-120 cc. Aortic atherosclerosis. No significant carotid bifurcation stenosis. These results were called by telephone during interpretation on 2020-03-26 at 10:16 pm to provider [facility] ZAM[facility] , who verbally acknowledged these results. Electronically Signed   By: Paulina Fusi [name]   On: 26-Mar-2020 22:31   MR ANGIO HEAD WO CONTRAST  Result Date: 03/10/2020 CLINICAL DATA:  Stroke follow-up.  COVID-19. EXAM: MRI HEAD WITHOUT CONTRAST MRA HEAD WITHOUT CONTRAST TECHNIQUE: Multiplanar, multiecho pulse sequences of the brain and surrounding structures were obtained without intravenous contrast. Angiographic images of the head were obtained using MRA technique without contrast. COMPARISON:  None. FINDINGS: MRI HEAD FINDINGS Brain: Large acute ischemic infarct involving the left anterior and middle cerebral artery territories. No contralateral infarct. There is 9 mm of rightward midline shift. Large amount accompanying cytotoxic edema. There is petechial and subarachnoid blood throughout the infarcted region. Otherwise, the white matter signal is normal. Vascular: Abnormal left internal carotid artery flow void. Skull and upper cervical spine: Normal marrow signal. Sinuses/Orbits: Fluid levels in the nasopharynx and paranasal sinuses. Small amount of mastoid fluid. Normal orbits. Other: None MRA HEAD FINDINGS POSTERIOR CIRCULATION: --Vertebral arteries: Normal V4 segments. --Inferior cerebellar arteries: Normal. --Basilar artery: Normal. --Superior cerebellar arteries: Normal. --Posterior cerebral arteries: Normal. 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 The right is normal. --Anterior cerebral arteries (ACA): The left anterior cerebral artery is supplied via the right A1 segment across the A-comm. Both A2 segments are patent. --Middle cerebral arteries (MCA): Right MCA is normal. Left MCA is completely occluded. No visible  collateral vessels in the left MCA territory. IMPRESSION: 1. Large acute ischemic infarct involving the left anterior and middle cerebral artery territories. Large amount of accompanying cytotoxic edema and 9 mm of rightward midline shift. 2. Petechial and subarachnoid hemorrhage over the infarcted left hemisphere Heidelberg classification 3c: Subarachnoid hemorrhage. 3. Complete occlusion of the left MCA. No visible collateral vessels in  the left MCA territory. 4. Left anterior cerebral artery A2 segment supplied via flow across the right A1 segment and A-comm. Electronically Signed   By: Deatra Robinson [name]   On: 03/10/2020 23:43   MR BRAIN WO CONTRAST  Result Date: 03/10/2020 CLINICAL DATA:  Stroke follow-up.  COVID-19. EXAM: MRI HEAD WITHOUT CONTRAST MRA HEAD WITHOUT CONTRAST TECHNIQUE: Multiplanar, multiecho pulse sequences of the brain and surrounding structures were obtained without intravenous contrast. Angiographic images of the head were obtained using MRA technique without contrast. COMPARISON:  None. FINDINGS: MRI HEAD FINDINGS Brain: Large acute ischemic infarct involving the left anterior and middle cerebral artery territories. No contralateral infarct. There is 9 mm of rightward midline shift. Large amount accompanying cytotoxic edema. There is petechial and subarachnoid blood throughout the infarcted region. Otherwise, the white matter signal is normal. Vascular: Abnormal left internal carotid artery flow void. Skull and upper cervical spine: Normal marrow signal. Sinuses/Orbits: Fluid levels in the nasopharynx and paranasal sinuses. Small amount of mastoid fluid. Normal orbits. Other: None MRA HEAD FINDINGS POSTERIOR CIRCULATION: --Vertebral arteries: Normal V4 segments. --Inferior cerebellar arteries: Normal. --Basilar artery: Normal. --Superior cerebellar arteries: Normal. --Posterior cerebral arteries: Normal. ANTERIOR CIRCULATION: --Intracranial internal carotid arteries: The left internal  carotid artery is completely occluded at the skull base. The right is normal. --Anterior cerebral arteries (ACA): The left anterior cerebral artery is supplied via the right A1 segment across the A-comm. Both A2 segments are patent. --Middle cerebral arteries (MCA): Right MCA is normal. Left MCA is completely occluded. No visible collateral vessels in the left MCA territory. IMPRESSION: 1. Large acute ischemic infarct involving the left anterior and middle cerebral artery territories. Large amount of accompanying cytotoxic edema and 9 mm of rightward midline shift. 2. Petechial and subarachnoid hemorrhage over the infarcted left hemisphere Heidelberg classification 3c: Subarachnoid hemorrhage. 3. Complete occlusion of the left MCA. No visible collateral vessels in the left MCA territory. 4. Left anterior cerebral artery A2 segment supplied via flow across the right A1 segment and A-comm. Electronically Signed   By: Deatra Robinson [name]   On: 03/10/2020 23:43   IR US Guide Vasc Access Right  Result Date: 03/07/2020 INDICATION: 69 year old female with a history of acute left MCA syndrome. Positive COVID exam 2 days prior, with presentation to the hospital with worsening respiratory symptoms, worsening oxygenation, and a witnessed ictus in the emergency department. Initiation of her therapy was delayed secondary to need for medical support/stabilization at [facility] hospital/emergency department. EXAM: ULTRASOUND-GUIDED ACCESS RIGHT COMMON FEMORAL ARTERY CERVICAL AND CEREBRAL ANGIOGRAM MECHANICAL THROMBECTOMY LEFT MCA ANGIO-SEAL FOR CLOSURE/HEMOSTASIS COMPARISON:  CT imaging same date MEDICATIONS: None ANESTHESIA/SEDATION: The anesthesia team was present to provide general endotracheal tube anesthesia and for patient monitoring during the procedure. Intubation was performed before the patient arrive, having occurred at [facility] department. Left radial arterial line was performed by the anesthesia team.  Interventional neuro radiology nursing staff was also present. CONTRAST:  125 cc FLUOROSCOPY TIME:  Fluoroscopy Time: 41 minutes 24 seconds (859.8 mGy). COMPLICATIONS: None TECHNIQUE: Informed written consent was obtained from the patient's family after a thorough discussion of the procedural risks, benefits and alternatives. Specific risks discussed include: Bleeding, infection, contrast reaction, kidney injury/failure, need for further procedure/surgery, arterial injury or dissection, embolization to new territory, intracranial hemorrhage (10-15% risk), neurologic deterioration, cardiopulmonary collapse, death. All questions were addressed. Maximal Sterile Barrier Technique was utilized including during the procedure including caps, mask, sterile gowns, sterile gloves, sterile drape, hand hygiene and skin  antiseptic. A timeout was performed prior to the initiation of the procedure. The anesthesia team was present to provide general endotracheal tube anesthesia and for patient monitoring during the procedure. Interventional neuro radiology nursing staff was also present. FINDINGS: Initial Findings: Left common carotid artery:  Normal course caliber and contour. Left external carotid artery: Patent with antegrade flow. Left internal carotid artery: Initial angiogram of the cervical ICA demonstrates a tight tortuous turn just after the origin, with intimal irregularity suggestive of either FM D, vasculitis, or a focal plaque, less likely dissection. After this segment, there is a second site of irregularity with some beading, at the level of the pharynx. The initial navigation of this segment with a microcatheter and microwire was greater than 20 minutes. Left MCA:  M1 occluded just after the terminus. Left ACA: A 1 segment patent. A 2 segment perfuses the right territory. Completion Findings: Left MCA: Successful embolectomy of M1, restoring greater than 50% of the perfuse territory into the left hemisphere. There  remains an occlusion of a frontal branch, which accounts for the decreased MCA. There is also embolus new territory, into the A2 segment on the left. Given the irregularity of the cervical ICA on not only the initial angiogram and a final angiogram just proximal to the skull base, there was concern for proceeding with any further attempts at embolectomy/thrombectomy. Flat panel CT performed at the completion demonstrates staining within left hemisphere with no intracranial hemorrhage/SAH. PROCEDURE: The anesthesia team was present to provide general endotracheal tube anesthesia and for patient monitoring during the procedure. Intubation was performed in negative pressure [place] in neuro IR holding. Interventional neuro radiology nursing staff was also present. Ultrasound survey of the right inguinal region was performed with images stored and sent to PACs. 11 blade scalpel was used to make a small incision. Blunt dissection was performed with US guidance. A micropuncture needle was used access the right common femoral artery under ultrasound. With excellent arterial blood flow returned, an .018 micro wire was passed through the needle, observed to enter the abdominal aorta under fluoroscopy. The needle was removed, and a micropuncture sheath was placed over the wire. The inner dilator and wire were removed, and an 035 wire was advanced under fluoroscopy into the abdominal aorta. The sheath was removed and a 25cm 82F straight vascular sheath was placed. The dilator was removed and the sheath was flushed. Sheath was attached to pressurized and heparinized saline bag for constant forward flow. A coaxial system was then advanced over the 035 wire. This included a 95cm 087 "Walrus" balloon guide with coaxial 125cm Berenstein diagnostic catheter. This was advanced to the proximal descending thoracic aorta. Wire was then removed. Double flush of the catheter was performed. Catheter was then used to select the left common  carotid artery. Angiogram was performed. Catheter and balloon guide were advanced over a standard glide wire into left common carotid artery, distal position. As the Glidewire advanced into the proximal cervical ICA, the tip buckled around a very tight tortuous segment with some tension perceived on the back end of the wire. The wire was removed and the catheter was withdrawn. Angiogram was performed. Given the irregular appearance in the proximal segment of the cervical ICA, we elected to proceed with microcatheter selection of the cervical ICA segment. The Berenstein catheter was removed from the balloon guide catheter. Double flush was performed. Rotating hemostatic valve was attached to the back end of the balloon guide. Pressurized saline bag was attached for forward flow.  A coaxial system was then advanced through the balloon catheter, which included the selected intermediate catheter, microcatheter, and microwire. In this scenario, the set up included a zoom 71 catheter, a Trevo Provue18 microcatheter, and 014 synchro soft wire. This system was advanced through the balloon guide catheter under the road-map function, with the assumed catheter tip position just beyond the balloon guide. At this point, we used a combination of micro wires and the microcatheter to navigate beyond the irregular segment in the proximal cervical ICA. This process took at least 20 minutes to navigate beyond this irregularity safely. Ultimately we achieved a luminal position of the microcatheter in the horizontal petrous ICA. These zoom 71 catheter easily advanced on the microcatheter and microwire combination. The balloon guide remained below the level of this irregularity. Microcatheter and the intermediate catheter system were advanced through the terminal ICA and MCA to the level of the occlusion. The micro wire was then carefully advanced through the occluded segment. Microcatheter was then manipulated through the occluded  segment and the wire was removed with saline drip at the hub. Microcatheter was advanced to the face of the thrombus, and then the intermediate catheter was navigated through the siphon, through the terminus to the face of the thrombus. The microcatheter and the microwire were removed and direct aspiration at the face of the thrombus was performed as our first pass. With cessation of flow within the aspiration catheter we observed for approximately 60 seconds. During this time, straightening of the cervical ICA segment was observed. Upon withdrawal of these in catheter, the balloon guide was navigated through this straight in the segment into the distal cervical segment, though not into the horizontal petrous. Assume catheter was completely removed. Double flush was performed at the balloon guide. Control angiogram was performed. Occlusion persisted at the M1 segment. We performed a second pass. The coaxial system was then advanced through the balloon catheter, which included the selected intermediate catheter, microcatheter, and microwire. We again used zoom 71 catheter, a Trevo Provue18 microcatheter, and 014 synchro soft wire. This system was advanced through the balloon guide catheter under the road-map function. Microcatheter and the intermediate catheter system were advanced through the terminal ICA and MCA to the level of the occlusion. The micro wire was then again carefully advanced through the occluded segment. Microcatheter was then manipulated through the occluded segment and the wire was removed with saline drip at the hub. Gentle contrast injected confirmed a luminal position of the microcatheter. A rotating hemostatic valve was then attached to the back end of the microcatheter, and a pressurized and heparinized saline bag was attached to the catheter. 4 x 40 solitaire device was then selected. Back flush was achieved at the rotating hemostatic valve, and then the device was gently advanced through  the microcatheter to the distal end. The retriever was then unsheathed by withdrawing the microcatheter under fluoroscopy. Once the retriever was completely unsheathed, the microcatheter was carefully stripped from the delivery device. A 3 minute time interval was observed. The balloon at the balloon guide catheter was then inflated under fluoroscopy for proximal flow arrest. Constant aspiration using the proprietary engine was then performed at the intermediate catheter, as the retriever was gently and slowly withdrawn with fluoroscopic observation. Once the retriever was "corked" within the tip of the intermediate catheter, both were removed from the system. Free aspiration was confirmed at the hub of the balloon guide catheter, with free blood return confirmed. The balloon was then deflated, and a control  angiogram was performed. Persistent complete occlusion was again confirmed in the distal M1 segment at the inflexion point of insular branches. We performed a third pass. The coaxial system was then advanced through the balloon catheter, which included the selected intermediate catheter, microcatheter, and microwire. We again used zoom 71 catheter, a Trevo Provue18 microcatheter, and 014 synchro soft wire. This system was advanced through the balloon guide catheter under the road-map function. Microcatheter and the intermediate catheter system were advanced through the terminal ICA and MCA to the level of the occlusion. The micro wire was then again carefully advanced through the occluded segment. Microcatheter was then manipulated through the occluded segment and the wire was removed with saline drip at the hub. Gentle contrast injected confirmed a luminal position of the microcatheter. A rotating hemostatic valve was then attached to the back end of the microcatheter, and a pressurized and heparinized saline bag was attached to the catheter. This time we selected 5 x 37 EMBO trap. Back flush was achieved at  the rotating hemostatic valve, and then the device was gently advanced through the microcatheter to the distal end. The retriever was then unsheathed by withdrawing the microcatheter under fluoroscopy. Once the retriever was completely unsheathed, the microcatheter was carefully stripped from the delivery device. Control angiogram was performed from the intermediate catheter. This angiogram demonstrated that the thrombo embolus was just at the inflection point into the M2 branches with forward flow through the stent treiver. A 3 minute time interval was observed. The balloon at the balloon guide catheter was then inflated under fluoroscopy for proximal flow arrest. Constant aspiration using the proprietary engine was then performed at the intermediate catheter, as the retriever was gently and slowly withdrawn with fluoroscopic observation. Once the retriever was "corked" within the tip of the intermediate catheter, both were removed from the system. Free aspiration was confirmed at the hub of the balloon guide catheter, with free blood return confirmed. The balloon was then deflated, and a control angiogram was performed. TICI 2b flow was confirmed, great than half of the affected territory. Angiogram of the cervical ICA was performed. Balloon guide was then removed. The skin at the puncture site was then cleaned with Chlorhexidine. The 8 French sheath was removed and an 72F angioseal was deployed. Flat panel CT was performed. Patient remained intubated, given the COVID status. Patient tolerated the procedure well and remained hemodynamically stable throughout. No complications were encountered.  Blood loss estimated 100 cc. IMPRESSION: Status post ultrasound guided access right common femoral artery for cervical/cerebral angiogram and mechanical thrombectomy of the left M1 occlusion, achieving TICI 2b flow. Affected branches at the conclusion include left-sided frontal branch, as well as embolism new territory of  the left A2 segment. Angio-Seal deployed for hemostasis. Signed, Yvone Neu. Reyne Dumas, RPVI [facility] Radiology PLAN: The patient will remain intubated, as she has positive COVID status. ICU status Target systolic blood pressure of 140-160 Right hip straight time 6 hours Frequent neurovascular checks Repeat neurologic imaging with CT and/MRI at the discretion of neurology team Electronically Signed   By: Gilmer Mor D.O.   On: Apr 08, 2020 09:46   CT CEREBRAL PERFUSION W CONTRAST  Result Date: 03/10/2020 CLINICAL DATA:  Coronavirus infection. Acute presentation with inability to speak. EXAM: CT ANGIOGRAPHY HEAD AND NECK CT PERFUSION BRAIN TECHNIQUE: Multidetector CT imaging of the head and neck was performed using the standard protocol during bolus administration of intravenous contrast. Multiplanar CT image reconstructions and MIPs were  obtained to evaluate the vascular anatomy. Carotid stenosis measurements (when applicable) are obtained utilizing NASCET criteria, using the distal internal carotid diameter as the denominator. Multiphase CT imaging of the brain was performed following IV bolus contrast injection. Subsequent parametric perfusion maps were calculated using RAPID software. CONTRAST:  OMNIPAQUE IOHEXOL 350 MG/ML SOLN COMPARISON:  None. FINDINGS: CTA NECK FINDINGS Aortic arch: Aortic atherosclerosis. Branching pattern is normal without origin stenosis. Right carotid system: Common carotid artery widely patent to the bifurcation. Calcified plaque in the ICA bulb with stenosis of 20%. Cervical ICA widely patent beyond that. Left carotid system: Common carotid artery widely patent to the bifurcation. Minimal calcified plaque in the distal bulb but no stenosis. Cervical ICA widely patent beyond that. Vertebral arteries: Both vertebral arteries widely patent at their origins and through the cervical region to the foramen magnum. Skeleton: Ordinary  spondylosis. Other neck: No mass or lymphadenopathy. Upper chest: Bilateral pulmonary infiltrates consistent with coronavirus pneumonia. Review of the MIP images confirms the above findings CTA HEAD FINDINGS Anterior circulation: Both internal carotid arteries are patent through the skull base and siphon regions. On the left, there is complete occlusion of the M1 segment. There appear to be poor collaterals. Left anterior cerebral artery shows flow. On the right, the anterior and middle cerebral arteries show flow. Posterior circulation: Both vertebral arteries patent to the basilar. No basilar stenosis. Posterior circulation branch vessels show flow. Venous sinuses: Patent and normal. Anatomic variants: None significant. 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 7 mL core infarction in the insula. 150 cc penumbra throughout the remainder of the MCA territory. This number is probably exaggerated because of inclusion of some areas not in the right MCA territory. True penumbra is probably in the range of 100-120 cc. Aortic atherosclerosis. No significant carotid bifurcation stenosis. These results were called by telephone during interpretation on 03/02/2020 at 10:16 pm to provider [facility] ZAM[facility] , who verbally acknowledged these results. Electronically Signed   By: Paulina Fusi [name]   On: 02/27/2020 22:31   DG Chest Port 1 View  Result Date: 03/10/2020 CLINICAL DATA:  Worsening diffuse infiltrate on the left. Improving but persistent patchy infiltrate on the right. EXAM: PORTABLE CHEST 1 VIEW COMPARISON:  March 09, 2020 FINDINGS: Diffuse opacity throughout the left lung has worsened in the interval. Patchy opacity in the right remains but is improved. The ETT is  in good position. A new right PICC line terminates near the caval atrial junction. The OG tube terminates below today's film. No pneumothorax. No change in the cardiomediastinal silhouette. IMPRESSION: 1. Support apparatus as above. The NG tube terminates below today's film with the side port likely just below the GE junction. Consider advancing several cm. The new right PICC line is in good position. 2. Worsening diffuse infiltrate on the left. Improving but persistent patchy infiltrate on the right. Electronically Signed   By: Gerome Sam III M.D   On: 03/10/2020 15:48   DG Chest Port 1 View  Result Date: 03/10/2020 CLINICAL DATA:  Status post intubation. EXAM: PORTABLE CHEST 1 VIEW COMPARISON:  March 09, 2020 FINDINGS: An endotracheal tube is seen with its distal tip extending into the right mainstem bronchus. Moderate severity diffuse interstitial edema is seen. This is increased in severity when compared to the  prior study. Mild stable multifocal infiltrates are noted bilaterally. There is no evidence of a pleural effusion or pneumothorax. The cardiac silhouette is mildly enlarged. There is moderate severity calcification of the aortic arch. Degenerative changes seen throughout the thoracic spine. Radiopaque contrast is seen within the bilateral renal collecting systems. IMPRESSION: 1. Endotracheal tube with its distal tip extending into the right mainstem bronchus. Withdrawal of the ET tube by approximately 3.0 cm is recommended. 2. Moderate severity diffuse interstitial infiltrates increased in severity when compared to the prior study. 3. Mild stable multifocal infiltrates. Electronically Signed   By: Aram Candela [name]   On: 03/20/2020 23:46   DG Chest Port 1 View  Result Date: 03-20-20 CLINICAL DATA:  COVID-19 positive 03/05/2020, altered level of consciousness, shortness of breath EXAM: PORTABLE CHEST 1 VIEW COMPARISON:  03/05/2020 FINDINGS: Single frontal view of the chest demonstrates  an unremarkable cardiac silhouette. There is increased interstitial prominence throughout the lungs, with developing bilateral ground-glass airspace disease. No effusion or pneumothorax. No acute bony abnormalities. IMPRESSION: 1. Worsening interstitial and ground-glass opacities, which may reflect multifocal pneumonia or edema. Electronically Signed   By: Sharlet Salina [name]   On: 2020-03-20 22:44   DG Chest Portable 1 View  Result Date: 03/05/2020 CLINICAL DATA:  Cough, shortness of breath, COVID-19 positive EXAM: PORTABLE CHEST 1 VIEW COMPARISON:  01/29/2011 FINDINGS: The heart size and mediastinal contours are within normal limits. Atherosclerotic calcification of the aortic knob. Patchy interstitial opacities most pronounced in the peripheral aspect of the left upper lobe. No pleural effusion or pneumothorax. Degenerative changes of the shoulders and thoracic spine. IMPRESSION: Patchy interstitial opacities most pronounced in the peripheral aspect of the left upper lobe. Findings most suggestive of atypical/viral pneumonia. Electronically Signed   By: Duanne Guess D.O.   On: 03/05/2020 11:05   DG Abd Portable 1V  Result Date: 03/10/2020 CLINICAL DATA:  Evaluate OG tube EXAM: PORTABLE ABDOMEN - 1 VIEW COMPARISON:  None. FINDINGS: Interstitial opacities in the lung bases. The side port of the OG tube is below the GE junction with the distal tip near the junction of the fundus and body. Contrast filled dilated renal collecting systems, left greater than right identified. IMPRESSION: 1. The OG tube is in good position. 2. Interstitial opacities in the lung bases persists. 3. Dilated renal collecting systems, left greater than right. Electronically Signed   By: Gerome Sam III M.D   On: 03/10/2020 10:38   MM 3D SCREEN BREAST BILATERAL  Result Date: 03/06/2020 CLINICAL DATA:  Screening. EXAM: DIGITAL SCREENING BILATERAL MAMMOGRAM WITH TOMO AND CAD COMPARISON:  Previous exam(s). ACR Breast Density  Category b: There are scattered areas of fibroglandular density. FINDINGS: There are no findings suspicious for malignancy. Images were processed with CAD. IMPRESSION: No mammographic evidence of malignancy. A result letter of this screening mammogram will be mailed directly to the patient. RECOMMENDATION: Screening mammogram in one year. (Code:[id number]-B-01Y) BI-RADS CATEGORY  1: Negative. Electronically Signed   By: Amie Portland [name]   On: 03/06/2020 09:01   IR PERCUTANEOUS ART THROMBECTOMY/INFUSION INTRACRANIAL INC DIAG ANGIO  Result Date: 03/15/2020 INDICATION: 69 year old female with a history of acute left MCA syndrome. Positive COVID exam 2 days prior, with presentation to the hospital with worsening respiratory symptoms, worsening oxygenation, and a witnessed ictus in the emergency department. Initiation of her therapy was delayed secondary to need for medical support/stabilization at [facility] hospital/emergency department. EXAM: ULTRASOUND-GUIDED ACCESS RIGHT COMMON FEMORAL ARTERY CERVICAL AND CEREBRAL ANGIOGRAM MECHANICAL THROMBECTOMY  LEFT MCA ANGIO-SEAL FOR CLOSURE/HEMOSTASIS COMPARISON:  CT imaging same date MEDICATIONS: None ANESTHESIA/SEDATION: The anesthesia team was present to provide general endotracheal tube anesthesia and for patient monitoring during the procedure. Intubation was performed before the patient arrive, having occurred at [facility] emergency department. Left radial arterial line was performed by the anesthesia team. Interventional neuro radiology nursing staff was also present. CONTRAST:  125 cc FLUOROSCOPY TIME:  Fluoroscopy Time: 41 minutes 24 seconds (859.8 mGy). COMPLICATIONS: None TECHNIQUE: Informed written consent was obtained from the patient's family after a thorough discussion of the procedural risks, benefits and alternatives. Specific risks discussed include: Bleeding, infection, contrast reaction, kidney injury/failure, need for further procedure/surgery, arterial injury  or dissection, embolization to new territory, intracranial hemorrhage (10-15% risk), neurologic deterioration, cardiopulmonary collapse, death. All questions were addressed. Maximal Sterile Barrier Technique was utilized including during the procedure including caps, mask, sterile gowns, sterile gloves, sterile drape, hand hygiene and skin antiseptic. A timeout was performed prior to the initiation of the procedure. The anesthesia team was present to provide general endotracheal tube anesthesia and for patient monitoring during the procedure. Interventional neuro radiology nursing staff was also present. FINDINGS: Initial Findings: Left common carotid artery:  Normal course caliber and contour. Left external carotid artery: Patent with antegrade flow. Left internal carotid artery: Initial angiogram of the cervical ICA demonstrates a tight tortuous turn just after the origin, with intimal irregularity suggestive of either FM D, vasculitis, or a focal plaque, less likely dissection. After this segment, there is a second site of irregularity with some beading, at the level of the pharynx. The initial navigation of this segment with a microcatheter and microwire was greater than 20 minutes. Left MCA:  M1 occluded just after the terminus. Left ACA: A 1 segment patent. A 2 segment perfuses the right territory. Completion Findings: Left MCA: Successful embolectomy of M1, restoring greater than 50% of the perfuse territory into the left hemisphere. There remains an occlusion of a frontal branch, which accounts for the decreased MCA. There is also embolus new territory, into the A2 segment on the left. Given the irregularity of the cervical ICA on not only the initial angiogram and a final angiogram just proximal to the skull base, there was concern for proceeding with any further attempts at embolectomy/thrombectomy. Flat panel CT performed at the completion demonstrates staining within left hemisphere with no intracranial  hemorrhage/SAH. PROCEDURE: The anesthesia team was present to provide general endotracheal tube anesthesia and for patient monitoring during the procedure. Intubation was performed in negative pressure [place] in neuro IR holding. Interventional neuro radiology nursing staff was also present. Ultrasound survey of the right inguinal region was performed with images stored and sent to PACs. 11 blade scalpel was used to make a small incision. Blunt dissection was performed with US guidance. A micropuncture needle was used access the right common femoral artery under ultrasound. With excellent arterial blood flow returned, an .018 micro wire was passed through the needle, observed to enter the abdominal aorta under fluoroscopy. The needle was removed, and a micropuncture sheath was placed over the wire. The inner dilator and wire were removed, and an 035 wire was advanced under fluoroscopy into the abdominal aorta. The sheath was removed and a 25cm 7F straight vascular sheath was placed. The dilator was removed and the sheath was flushed. Sheath was attached to pressurized and heparinized saline bag for constant forward flow. A coaxial system was then advanced over the 035 wire. This included a 95cm 087 "[place]"  balloon guide with coaxial 125cm Berenstein diagnostic catheter. This was advanced to the proximal descending thoracic aorta. Wire was then removed. Double flush of the catheter was performed. Catheter was then used to select the left common carotid artery. Angiogram was performed. Catheter and balloon guide were advanced over a standard glide wire into left common carotid artery, distal position. As the Glidewire advanced into the proximal cervical ICA, the tip buckled around a very tight tortuous segment with some tension perceived on the back end of the wire. The wire was removed and the catheter was withdrawn. Angiogram was performed. Given the irregular appearance in the proximal segment of the cervical ICA, we  elected to proceed with microcatheter selection of the cervical ICA segment. The Berenstein catheter was removed from the balloon guide catheter. Double flush was performed. Rotating hemostatic valve was attached to the back end of the balloon guide. Pressurized saline bag was attached for forward flow. A coaxial system was then advanced through the balloon catheter, which included the selected intermediate catheter, microcatheter, and microwire. In this scenario, the set up included a zoom 71 catheter, a Trevo Provue18 microcatheter, and 014 synchro soft wire. This system was advanced through the balloon guide catheter under the road-map function, with the assumed catheter tip position just beyond the balloon guide. At this point, we used a combination of micro wires and the microcatheter to navigate beyond the irregular segment in the proximal cervical ICA. This process took at least 20 minutes to navigate beyond this irregularity safely. Ultimately we achieved a luminal position of the microcatheter in the horizontal petrous ICA. These zoom 71 catheter easily advanced on the microcatheter and microwire combination. The balloon guide remained below the level of this irregularity. Microcatheter and the intermediate catheter system were advanced through the terminal ICA and MCA to the level of the occlusion. The micro wire was then carefully advanced through the occluded segment. Microcatheter was then manipulated through the occluded segment and the wire was removed with saline drip at the hub. Microcatheter was advanced to the face of the thrombus, and then the intermediate catheter was navigated through the siphon, through the terminus to the face of the thrombus. The microcatheter and the microwire were removed and direct aspiration at the face of the thrombus was performed as our first pass. With cessation of flow within the aspiration catheter we observed for approximately 60 seconds. During this time,  straightening of the cervical ICA segment was observed. Upon withdrawal of these in catheter, the balloon guide was navigated through this straight in the segment into the distal cervical segment, though not into the horizontal petrous. Assume catheter was completely removed. Double flush was performed at the balloon guide. Control angiogram was performed. Occlusion persisted at the M1 segment. We performed a second pass. The coaxial system was then advanced through the balloon catheter, which included the selected intermediate catheter, microcatheter, and microwire. We again used zoom 71 catheter, a Trevo Provue18 microcatheter, and 014 synchro soft wire. This system was advanced through the balloon guide catheter under the road-map function. Microcatheter and the intermediate catheter system were advanced through the terminal ICA and MCA to the level of the occlusion. The micro wire was then again carefully advanced through the occluded segment. Microcatheter was then manipulated through the occluded segment and the wire was removed with saline drip at the hub. Gentle contrast injected confirmed a luminal position of the microcatheter. A rotating hemostatic valve was then attached to the back end of the  microcatheter, and a pressurized and heparinized saline bag was attached to the catheter. 4 x 40 solitaire device was then selected. Back flush was achieved at the rotating hemostatic valve, and then the device was gently advanced through the microcatheter to the distal end. The retriever was then unsheathed by withdrawing the microcatheter under fluoroscopy. Once the retriever was completely unsheathed, the microcatheter was carefully stripped from the delivery device. A 3 minute time interval was observed. The balloon at the balloon guide catheter was then inflated under fluoroscopy for proximal flow arrest. Constant aspiration using the proprietary engine was then performed at the intermediate catheter, as the  retriever was gently and slowly withdrawn with fluoroscopic observation. Once the retriever was "corked" within the tip of the intermediate catheter, both were removed from the system. Free aspiration was confirmed at the hub of the balloon guide catheter, with free blood return confirmed. The balloon was then deflated, and a control angiogram was performed. Persistent complete occlusion was again confirmed in the distal M1 segment at the inflexion point of insular branches. We performed a third pass. The coaxial system was then advanced through the balloon catheter, which included the selected intermediate catheter, microcatheter, and microwire. We again used zoom 71 catheter, a Trevo Provue18 microcatheter, and 014 synchro soft wire. This system was advanced through the balloon guide catheter under the road-map function. Microcatheter and the intermediate catheter system were advanced through the terminal ICA and MCA to the level of the occlusion. The micro wire was then again carefully advanced through the occluded segment. Microcatheter was then manipulated through the occluded segment and the wire was removed with saline drip at the hub. Gentle contrast injected confirmed a luminal position of the microcatheter. A rotating hemostatic valve was then attached to the back end of the microcatheter, and a pressurized and heparinized saline bag was attached to the catheter. This time we selected 5 x 37 EMBO trap. Back flush was achieved at the rotating hemostatic valve, and then the device was gently advanced through the microcatheter to the distal end. The retriever was then unsheathed by withdrawing the microcatheter under fluoroscopy. Once the retriever was completely unsheathed, the microcatheter was carefully stripped from the delivery device. Control angiogram was performed from the intermediate catheter. This angiogram demonstrated that the thrombo embolus was just at the inflection point into the M2 branches  with forward flow through the stent treiver. A 3 minute time interval was observed. The balloon at the balloon guide catheter was then inflated under fluoroscopy for proximal flow arrest. Constant aspiration using the proprietary engine was then performed at the intermediate catheter, as the retriever was gently and slowly withdrawn with fluoroscopic observation. Once the retriever was "corked" within the tip of the intermediate catheter, both were removed from the system. Free aspiration was confirmed at the hub of the balloon guide catheter, with free blood return confirmed. The balloon was then deflated, and a control angiogram was performed. TICI 2b flow was confirmed, great than half of the affected territory. Angiogram of the cervical ICA was performed. Balloon guide was then removed. The skin at the puncture site was then cleaned with Chlorhexidine. The 8 French sheath was removed and an 33F angioseal was deployed. Flat panel CT was performed. Patient remained intubated, given the COVID status. Patient tolerated the procedure well and remained hemodynamically stable throughout. No complications were encountered.  Blood loss estimated 100 cc. IMPRESSION: Status post ultrasound guided access right common femoral artery for cervical/cerebral angiogram and mechanical thrombectomy  of the left M1 occlusion, achieving TICI 2b flow. Affected branches at the conclusion include left-sided frontal branch, as well as embolism new territory of the left A2 segment. Angio-Seal deployed for hemostasis. Signed, Yvone Neu. Reyne Dumas, RPVI [facility] Radiology PLAN: The patient will remain intubated, as she has positive COVID status. ICU status Target systolic blood pressure of 140-160 Right hip straight time 6 hours Frequent neurovascular checks Repeat neurologic imaging with CT and/MRI at the discretion of neurology team Electronically Signed   By: Gilmer Mor D.O.   On:  03/18/2020 09:46   CT HEAD CODE STROKE WO CONTRAST  Result Date: 03/06/2020 CLINICAL DATA:  Code stroke. Coronavirus infection. Febrile. Altered mental status. EXAM: CT HEAD WITHOUT CONTRAST TECHNIQUE: Contiguous axial images were obtained from the base of the skull through the vertex without intravenous contrast. COMPARISON:  None. FINDINGS: Brain: The study suffers from some motion degradation. There is mild age related volume loss. No sign of acute infarction, mass lesion, hemorrhage, hydrocephalus or extra-axial collection. Vascular: There is atherosclerotic calcification of the major vessels at the base of the brain. Skull: Negative Sinuses/Orbits: Clear/normal Other: None ASPECTS ([place] Stroke Program Early CT Score) - Ganglionic level infarction (caudate, lentiform nuclei, internal capsule, insula, M1-M3 cortex): 7 - Supraganglionic infarction (M4-M6 cortex): 3 Total score (0-10 with 10 being normal): 10 IMPRESSION: 1. Mild age related volume loss.  Otherwise normal head CT. 2. ASPECTS is 10 3. These results were called by telephone at the time of interpretation on 02/22/2020 at 9:53 pm to provider [facility] ZAMMIT , who verbally acknowledged these results. Electronically Signed   By: Paulina Fusi [name]   On: 02/24/2020 21:57   VAS [place] LOWER EXTREMITY VENOUS (DVT)  Result Date: 03/10/2020  Lower Venous DVTStudy Indications: Covid-19, stroke. Other Indications: Status post mechanical thrombectomy for stroke. Comparison Study: No prior study on file Performing Technologist: Sherren Kerns RVS  Examination Guidelines: A complete evaluation includes B-mode imaging, spectral Doppler, color Doppler, and power Doppler as needed of all accessible portions of each vessel. Bilateral testing is considered an integral part of a complete examination. Limited examinations for reoccurring indications may be performed as noted. The reflux portion of the exam is performed with the patient in reverse Trendelenburg.   +---------+---------------+---------+-----------+----------+--------------+ RIGHT    CompressibilityPhasicitySpontaneityPropertiesThrombus Aging +---------+---------------+---------+-----------+----------+--------------+ CFV      Full           Yes      Yes                                 +---------+---------------+---------+-----------+----------+--------------+ SFJ      Full                                                        +---------+---------------+---------+-----------+----------+--------------+ FV Prox  Full                                                        +---------+---------------+---------+-----------+----------+--------------+ FV Mid   Full                                                        +---------+---------------+---------+-----------+----------+--------------+  FV DistalFull                                                        +---------+---------------+---------+-----------+----------+--------------+ PFV      Full                                                        +---------+---------------+---------+-----------+----------+--------------+ POP      Full           Yes      Yes                                 +---------+---------------+---------+-----------+----------+--------------+ PTV      None                                                        +---------+---------------+---------+-----------+----------+--------------+ PERO     None                                                        +---------+---------------+---------+-----------+----------+--------------+   +---------+---------------+---------+-----------+----------+--------------+ LEFT     CompressibilityPhasicitySpontaneityPropertiesThrombus Aging +---------+---------------+---------+-----------+----------+--------------+ CFV      Full           Yes      Yes                                  +---------+---------------+---------+-----------+----------+--------------+ SFJ      Full                                                        +---------+---------------+---------+-----------+----------+--------------+ FV Prox  Full                                                        +---------+---------------+---------+-----------+----------+--------------+ FV Mid   Full                                                        +---------+---------------+---------+-----------+----------+--------------+ FV DistalFull                                                        +---------+---------------+---------+-----------+----------+--------------+  PFV      Full                                                        +---------+---------------+---------+-----------+----------+--------------+ POP      Full           Yes      Yes                                 +---------+---------------+---------+-----------+----------+--------------+ PTV      None                                                        +---------+---------------+---------+-----------+----------+--------------+ PERO     None                                                        +---------+---------------+---------+-----------+----------+--------------+     Summary: RIGHT: - Findings consistent with acute deep vein thrombosis involving the right posterior tibial veins, and right peroneal veins.  LEFT: - Findings consistent with acute deep vein thrombosis involving the left posterior tibial veins, and left peroneal veins.  *See table(s) above for measurements and observations. Electronically signed by Waverly Ferrarihristopher Dickson MD on 03/10/2020 at 3:16:14 PM.    Final    US EKG SITE RITE  Result Date: 03/10/2020 If Site Rite image not attached, placement could not be confirmed due to current cardiac rhythm.   Microbiology No results found for this or any previous visit (from the past 240  hour(s)).  Lab Basic Metabolic Panel: No results for input(s): NA, K, CL, CO2, GLUCOSE, BUN, CREATININE, CALCIUM, MG, PHOS in the last 168 hours. Liver Function Tests: No results for input(s): AST, ALT, ALKPHOS, BILITOT, PROT, ALBUMIN in the last 168 hours. No results for input(s): LIPASE, AMYLASE in the last 168 hours. No results for input(s): AMMONIA in the last 168 hours. CBC: No results for input(s): WBC, NEUTROABS, HGB, HCT, MCV, PLT in the last 168 hours. Cardiac Enzymes: No results for input(s): CKTOTAL, CKMB, CKMBINDEX, TROPONINI in the last 168 hours. Sepsis Labs: No results for input(s): PROCALCITON, WBC, LATICACIDVEN in the last 168 hours.  Procedures/Operations  Thrombectomy  ETT    Jenin Birdsall L Demetruis Depaul 03/22/2020, 10:06 PM
